# Patient Record
Sex: Female | Born: 1970
Health system: Midwestern US, Community
[De-identification: ages and names within clinical notes are randomized; demographics above are authoritative.]

---

## 2014-11-16 ENCOUNTER — Emergency Department: Admit: 2014-11-17 | Payer: PRIVATE HEALTH INSURANCE | Primary: Internal Medicine

## 2014-11-16 DIAGNOSIS — M5412 Radiculopathy, cervical region: Secondary | ICD-10-CM

## 2014-11-16 NOTE — ED Provider Notes (Addendum)
HPI Comments: Pt presents c/o left upper chest pain / neck pain x 4 weeks intermittent . Pain radiating to left arm aggravated with movement of her neck . Pt was seen in [facility]. She was seen by Cardiologist stress test was neg as per pt . Denies sob, palpitation, n/v/d.     Patient is a 44 y.o. female presenting with chest pain. The history is provided by the patient.   Chest Pain (Angina)   Pertinent negatives include no abdominal pain, no cough, no dizziness, no fever, no headaches, no nausea, no palpitations and no vomiting.        Past Medical History:   Diagnosis Date   ??? Acid reflux        History reviewed. No pertinent past surgical history.      History reviewed. No pertinent family history.    History     Social History   ??? Marital Status: UNKNOWN     Spouse Name: N/A   ??? Number of Children: N/A   ??? Years of Education: N/A     Occupational History   ??? Not on file.     Social History Main Topics   ??? Smoking status: Never Smoker    ??? Smokeless tobacco: Not on file   ??? Alcohol Use: No   ??? Drug Use: No   ??? Sexual Activity: Not on file     Other Topics Concern   ??? Not on file     Social History Narrative   ??? No narrative on file         ALLERGIES: Azithromycin and Fish containing products    Review of Systems   Constitutional: Negative for fever and chills.   Respiratory: Negative for cough and wheezing.    Cardiovascular: Positive for chest pain. Negative for palpitations and leg swelling.   Gastrointestinal: Negative for nausea, vomiting and abdominal pain.   Musculoskeletal: Positive for neck pain.   Skin: Negative for color change and wound.   Neurological: Negative for dizziness and headaches.       Filed Vitals:    11/16/14 2227   BP: 122/71   Pulse: 81   Temp: 98 ??F (36.7 ??C)   Resp: 18   Height: 5' (1.524 m)   Weight: 63.504 kg (140 lb)   SpO2: 99%            Physical Exam   Constitutional: She is oriented to person, place, and time. She appears well-developed and well-nourished.   HENT:    Head: Normocephalic and atraumatic.   Eyes: Conjunctivae and EOM are normal. Pupils are equal, round, and reactive to light.   Neck: Normal range of motion. Neck supple. No JVD present. Muscular tenderness present. No spinous process tenderness present. Carotid bruit is not present. No tracheal deviation present. No thyromegaly present.       Cardiovascular: Normal rate, regular rhythm, normal heart sounds and intact distal pulses.    No murmur heard.  Pulmonary/Chest: Effort normal and breath sounds normal. No stridor. No respiratory distress. She has no wheezes. She has no rales. She exhibits no tenderness.   Abdominal: Soft. There is no tenderness. There is no rebound and no guarding.   Musculoskeletal: Normal range of motion. She exhibits no edema or tenderness.   Lymphadenopathy:     She has no cervical adenopathy.   Neurological: She is alert and oriented to person, place, and time. She has normal strength. No cranial nerve deficit. She displays a negative  Romberg sign. Coordination normal. GCS eye subscore is 4. GCS verbal subscore is 5. GCS motor subscore is 6.   Skin: Skin is warm. No rash noted. No erythema. No pallor.   Nursing note and vitals reviewed.       MDM  Number of Diagnoses or Management Options  Radiculopathy, unspecified spinal region:      Amount and/or Complexity of Data Reviewed  Tests in the radiology section of CPT??: ordered and reviewed  Independent visualization of images, tracings, or specimens: yes        Procedures    LABS:    No results found for this or any previous visit (from the past 24 hour(s)).  EKG NSR 77, NO ST ELEVATION     EMERGENCY DEPARTMENT CASE SUMMARY>    Impression/Differential Diagnosis: cervical radiculopathy     Plan: meds, images     ED Course: d/c home pmd f/u     Final Impression/Diagnosis:   1. Radiculopathy, unspecified spinal region        Patient condition at time of disposition: stable       I have reviewed the following home medications:     Prior to Admission medications    Medication Sig Start Date End Date Taking? Authorizing Provider   oxyCODONE-acetaminophen (PERCOCET) 5-325 mg per tablet Take 1 Tab by mouth every four (4) hours as needed for Pain. Max Daily Amount: 6 Tabs. 11/17/14  Yes Aleen Campi, PA   metaxalone (SKELAXIN) 800 mg tablet Take 1 Tab by mouth three (3) times daily. 11/17/14  Yes Aleen Campi, PA   aspirin 81 mg chewable tablet Take 81 mg by mouth daily.   Yes Phys Other, MD         Aleen Campi, PA    I was personally available for consultation in the emergency department.  I have reviewed the chart and agree with the documentation recorded by the [facility], including the assessment, treatment plan, and disposition.  Agustin Cree, MD

## 2014-11-16 NOTE — ED Notes (Signed)
Rec'd pt with c/o chest pain on & off x4 weeks

## 2014-11-17 ENCOUNTER — Inpatient Hospital Stay
Admit: 2014-11-17 | Discharge: 2014-11-17 | Disposition: A | Discharge status: Home / Self Care | Payer: PRIVATE HEALTH INSURANCE | Attending: Emergency Medicine

## 2014-11-17 MED ORDER — OXYCODONE-ACETAMINOPHEN 5 MG-325 MG TAB
5-325 mg | ORAL_TABLET | ORAL | Status: AC | PRN
Start: 2014-11-17 — End: ?

## 2014-11-17 MED ORDER — KETOROLAC TROMETHAMINE 30 MG/ML INJECTION
30 mg/mL (1 mL) | INTRAMUSCULAR | Status: AC
Start: 2014-11-17 — End: 2014-11-16
  Administered 2014-11-17: 04:00:00 via INTRAMUSCULAR

## 2014-11-17 MED ORDER — METAXALONE 800 MG TAB
800 mg | ORAL_TABLET | Freq: Three times a day (TID) | ORAL | Status: AC
Start: 2014-11-17 — End: ?

## 2014-11-17 MED ORDER — CYCLOBENZAPRINE 10 MG TAB
10 mg | ORAL | Status: AC
Start: 2014-11-17 — End: 2014-11-16
  Administered 2014-11-17: 04:00:00 via ORAL

## 2014-11-17 MED FILL — KETOROLAC TROMETHAMINE 30 MG/ML INJECTION: 30 mg/mL (1 mL) | INTRAMUSCULAR | Qty: 1

## 2014-11-17 MED FILL — CYCLOBENZAPRINE 10 MG TAB: 10 mg | ORAL | Qty: 1

## 2014-11-17 NOTE — ED Notes (Signed)
Pt is discharged to home with instructions and verbalized understanding, Safety maintained.

## 2014-11-20 LAB — EKG, 12 LEAD, INITIAL
Atrial Rate: 77 {beats}/min
Calculated P Axis: 56 degrees
Calculated R Axis: 70 degrees
Calculated T Axis: 43 degrees
Diagnosis: NORMAL
P-R Interval: 134 ms
Q-T Interval: 388 ms
QRS Duration: 76 ms
QTC Calculation (Bezet): 439 ms
Ventricular Rate: 77 {beats}/min

## 2015-02-28 IMAGING — MG MAMMO DIAG LT
2 series · 2 of 2 positions shown · non-contrast
Comparison: none

Images Obtained from [facility]
Comparison is made to exam dated:  02/12/2015.
The tissue of the left breast is heterogeneously dense. This may lower the sensitivity of mammography.
Patient was referred today for stereotactic biopsy of left breast calcifications seen on outside films. Due to the suboptimal resolution of outside films, we performed pre-procedural imaging to
confirm and better define the lesion.
There is a 0.3 cm x 0.3 cm x 0.3 cm cluster of amorphous punctate calcifications in the left breast at 1 o'clock middle depth.
No other significant masses or calcifications are seen in the breast.
Your patient's mammogram demonstrates that she has dense breast tissue, which could hide small abnormalities.  In compliance with [place] Act [place] 1521 the patient has been sent a letter which informs
her that she has dense breast tissue and might benefit from supplementary screening tests depending on her individual risk factors.  The patient may contact you if she has any questions or concerns.

[L LM]
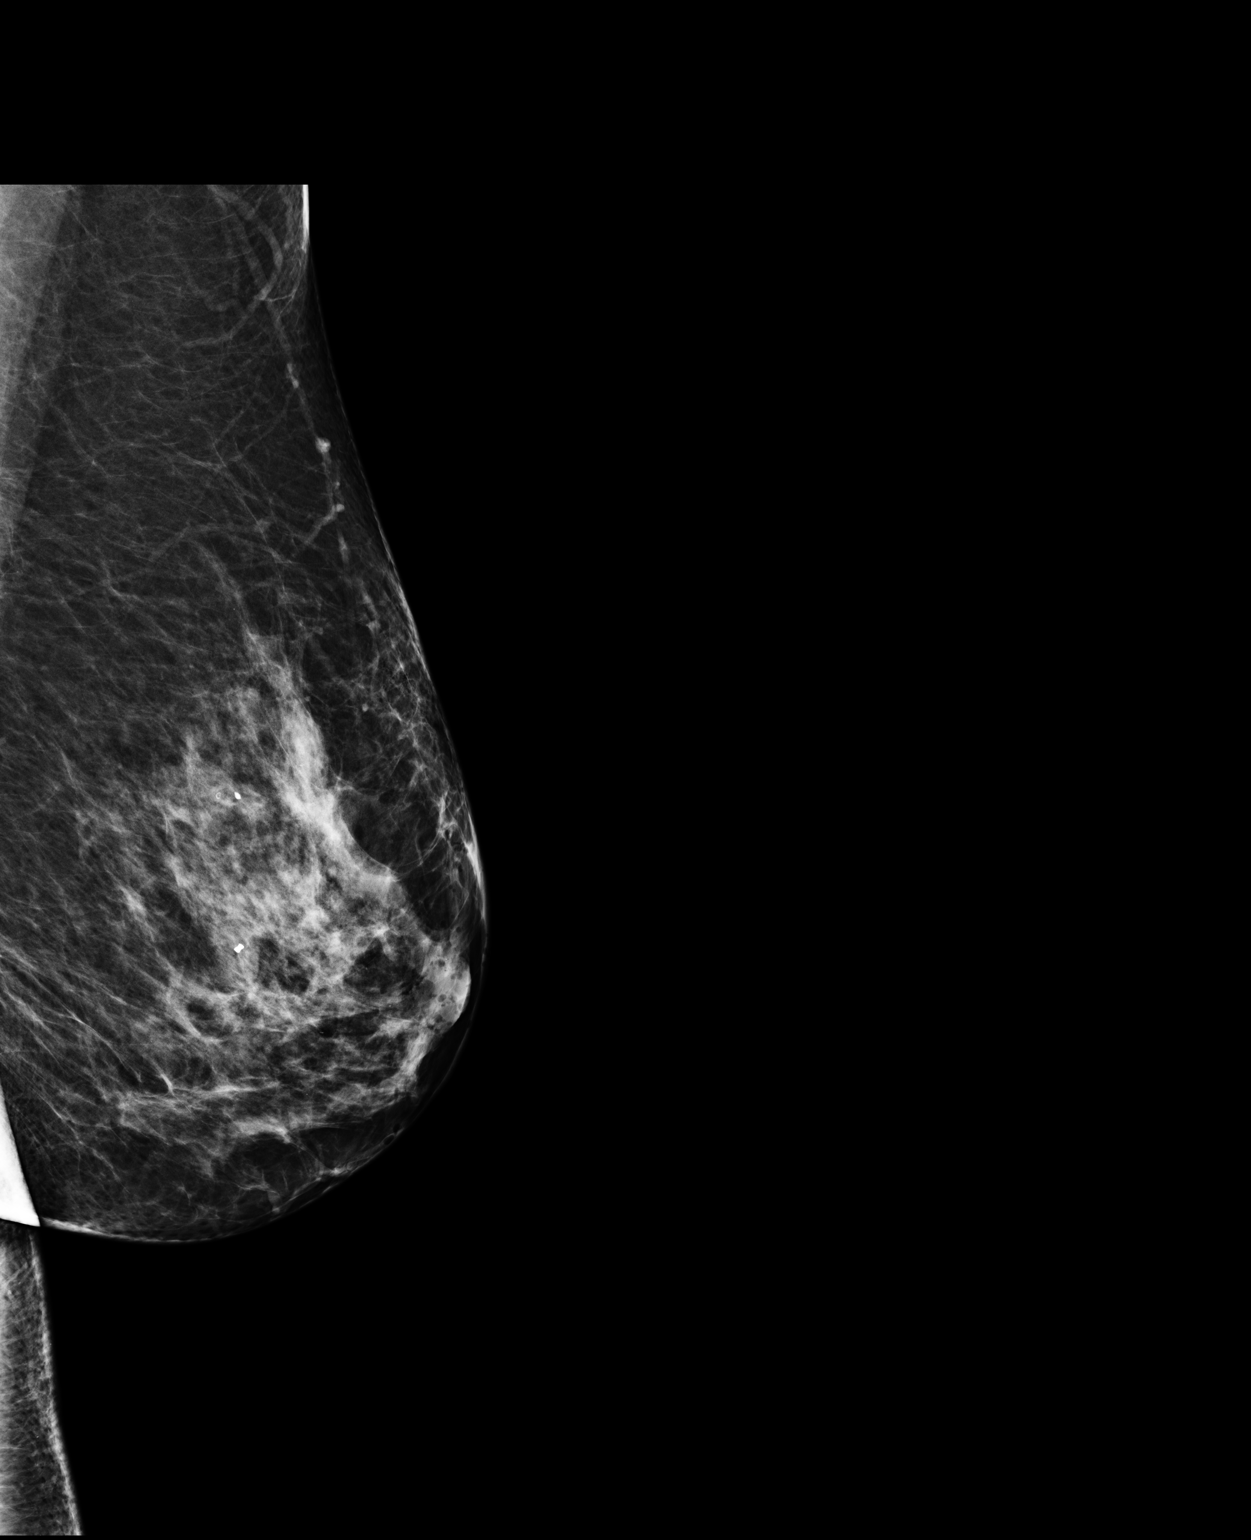

[L CC]
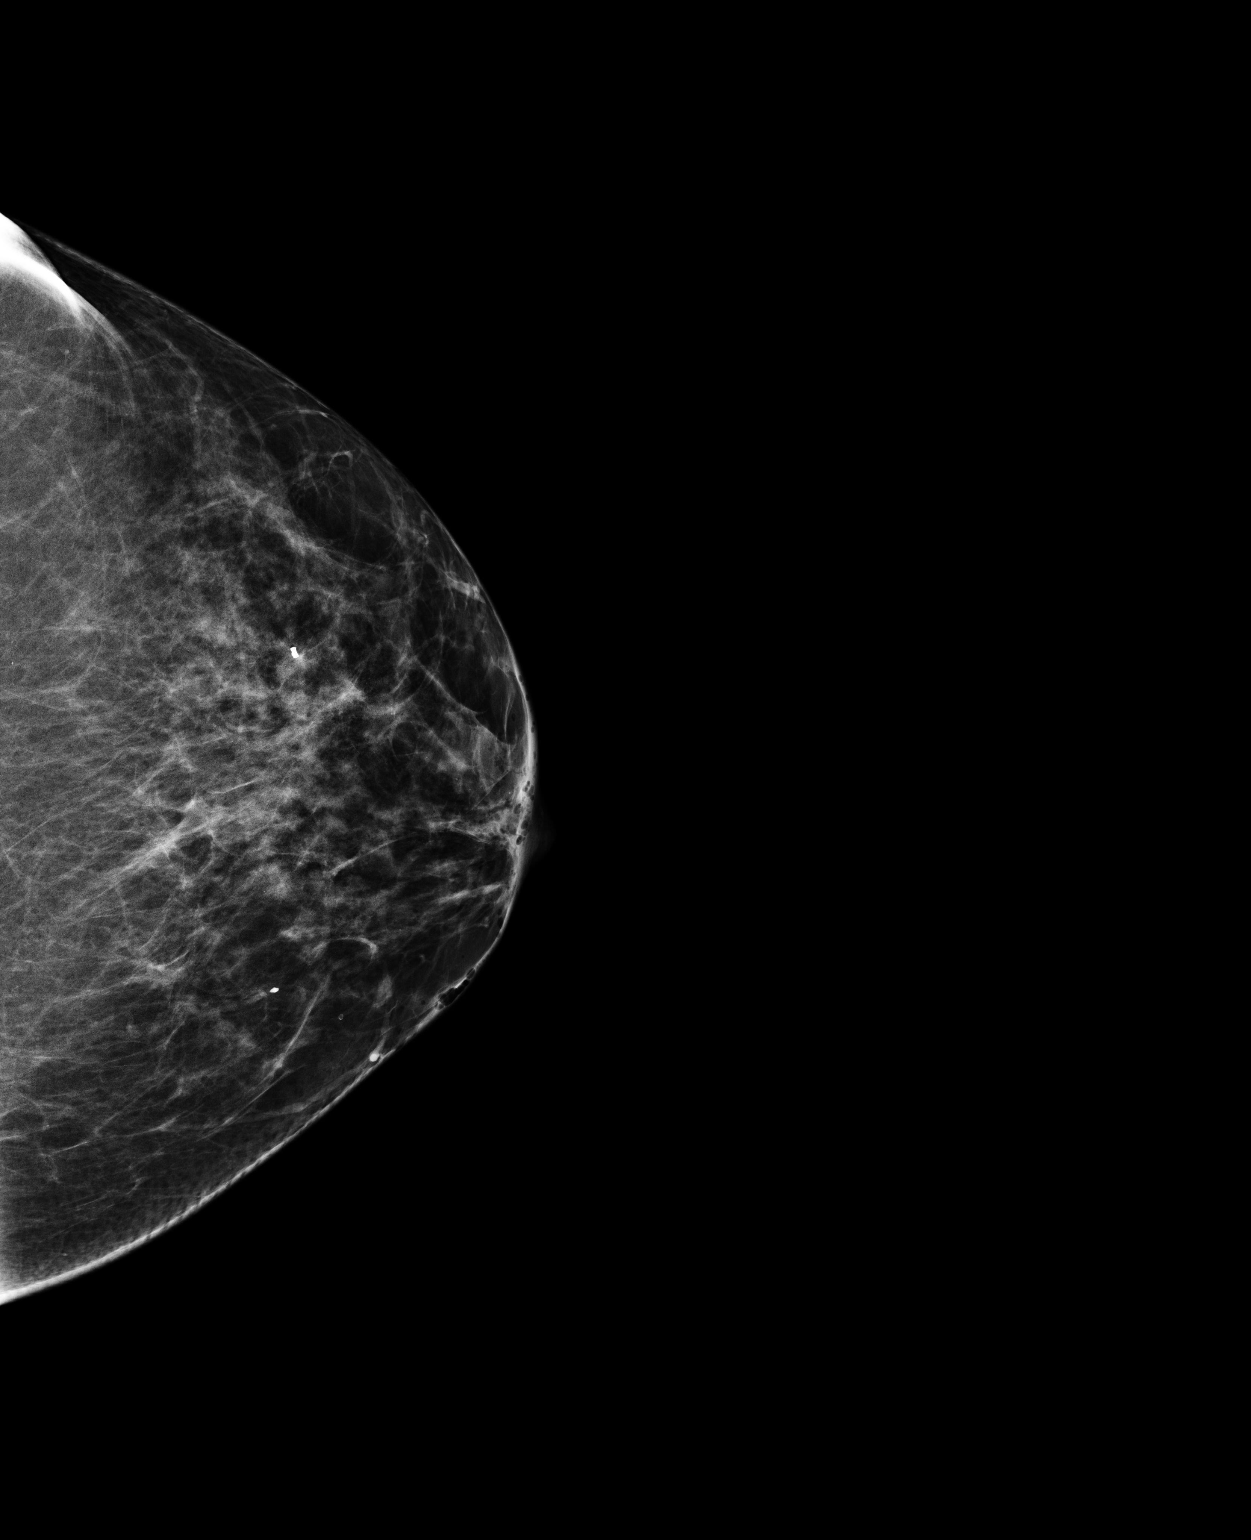

[2 of 2 positions shown; findings below may reference images not displayed]

IMPRESSION: The 0.3 cm x 0.3 cm x 0.3 cm cluster of amorphous punctate calcifications in the left breast is at a low suspicion for malignancy.  We will proceed with stereotactic biopsy per outside report
recommendations.
mc/:02/28/2015 [DATE]
Mammogram BI-RADS: 4a Suspicious abnormality - low suspicion for malignancy   [id number]

## 2015-02-28 IMAGING — MG STEREO BIOPSY LT
8 series · 8 of 8 positions shown · non-contrast
Comparison: none

Images Obtained from [facility]
CLINICAL RA REF: Biopsy of the left breast for calcifications.
Comparison is made to exam dated:  02/28/2015 [HOSPITAL] - [HOSPITAL].
The tissue of the left breast is heterogeneously dense. This may lower the sensitivity of mammography.
There is a marker clip in the appropriate position in the left breast at 1 o'clock anterior depth.  This marker clip placement is at biopsy site.
Your patient's mammogram demonstrates that she has dense breast tissue, which could hide small abnormalities.  In compliance with [place] Act [place] 3633 the patient has been sent a letter which informs
her that she has dense breast tissue and might benefit from supplementary screening tests depending on her individual risk factors.  The patient may contact you if she has any questions or concerns.

[L CC (1 of 8)]
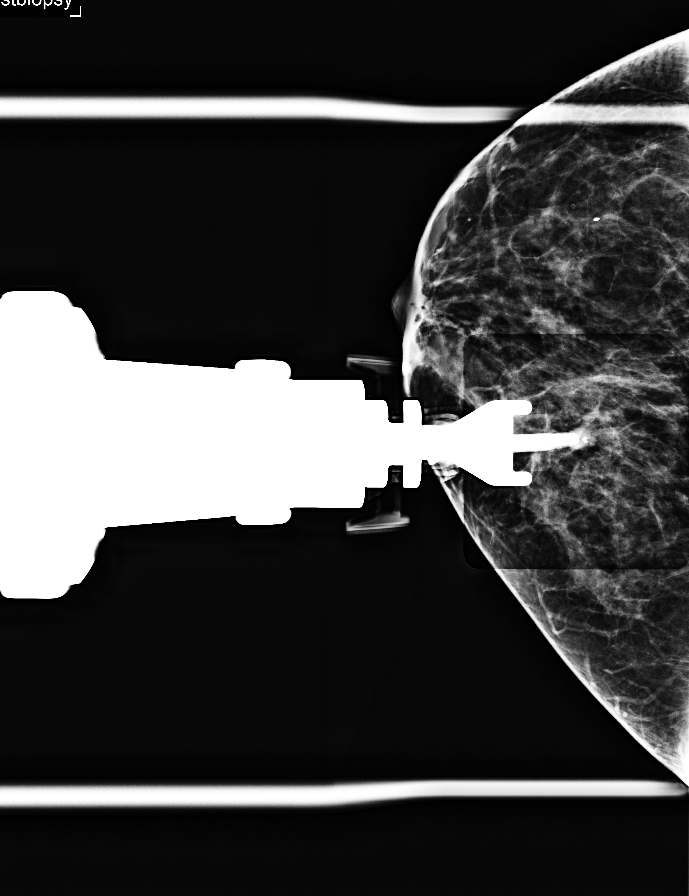

[L CC (2 of 8)]
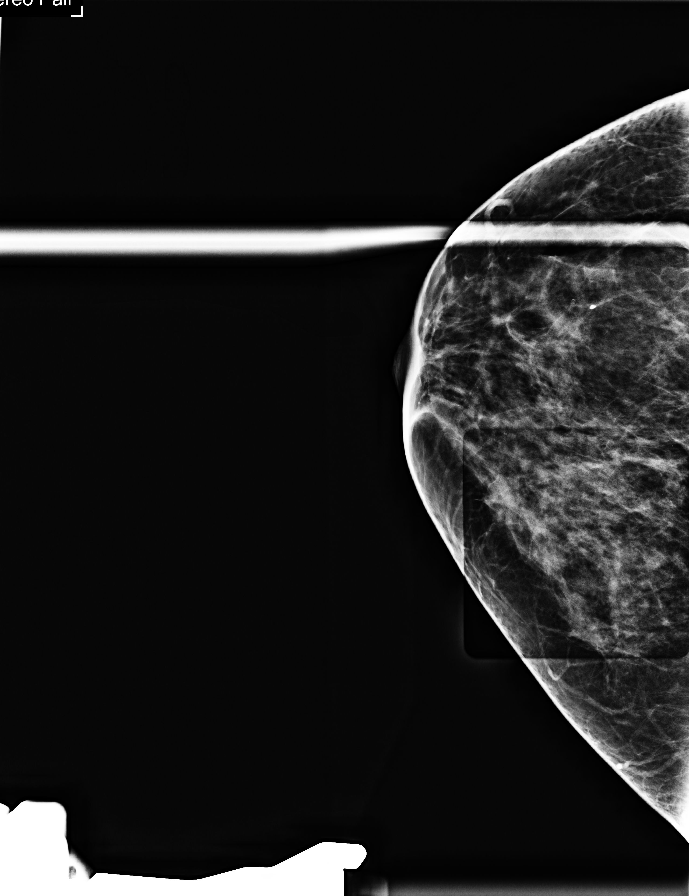

[L CC (3 of 8)]
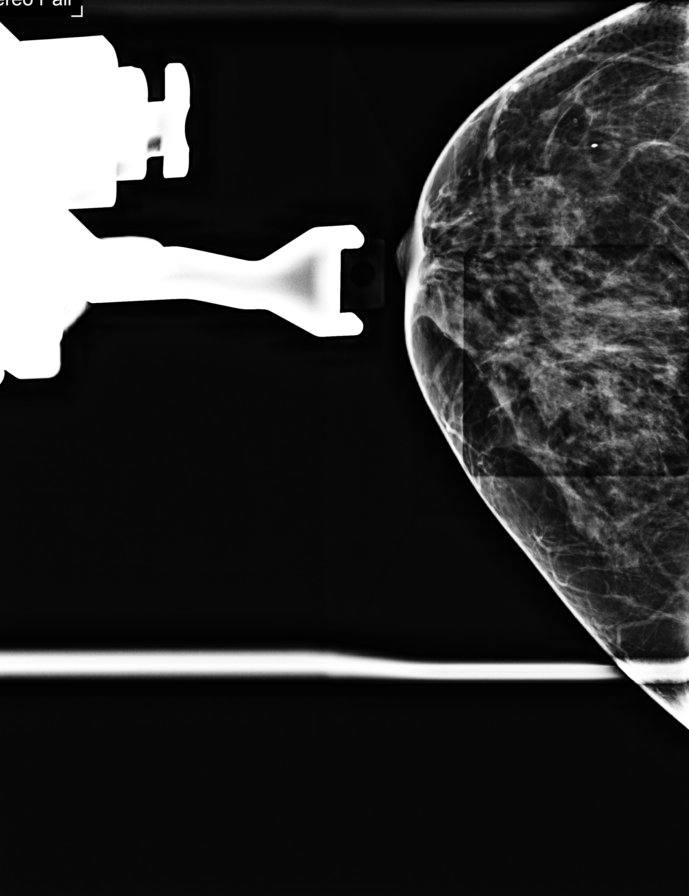

[L CC (4 of 8)]
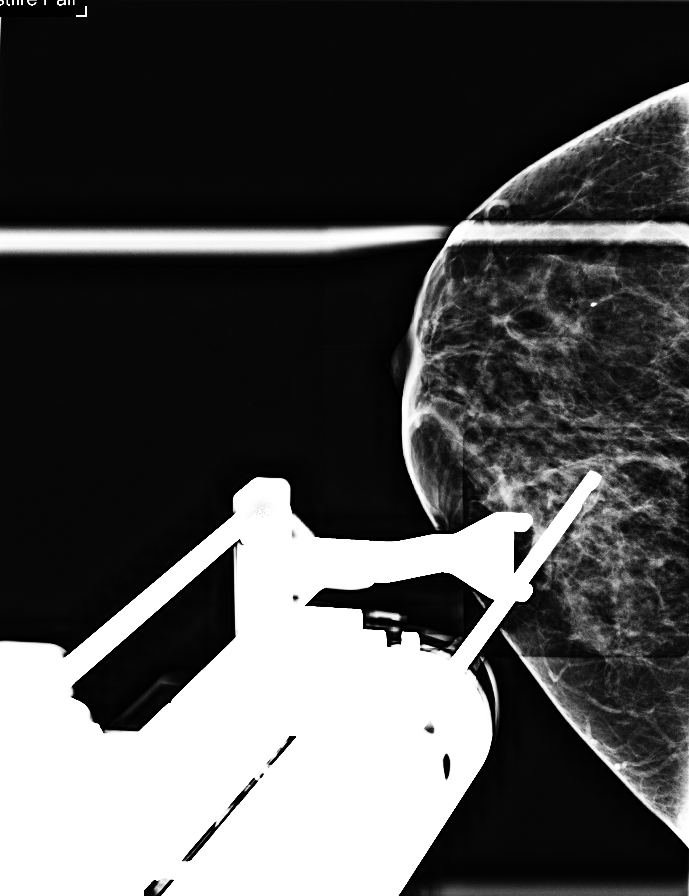

[L CC (5 of 8)]
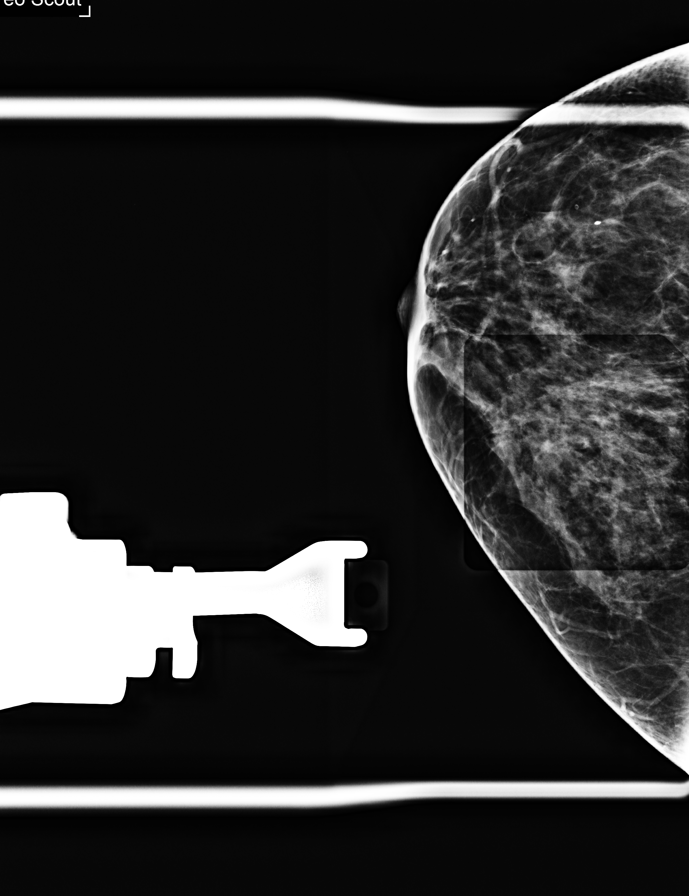

[L CC (6 of 8)]
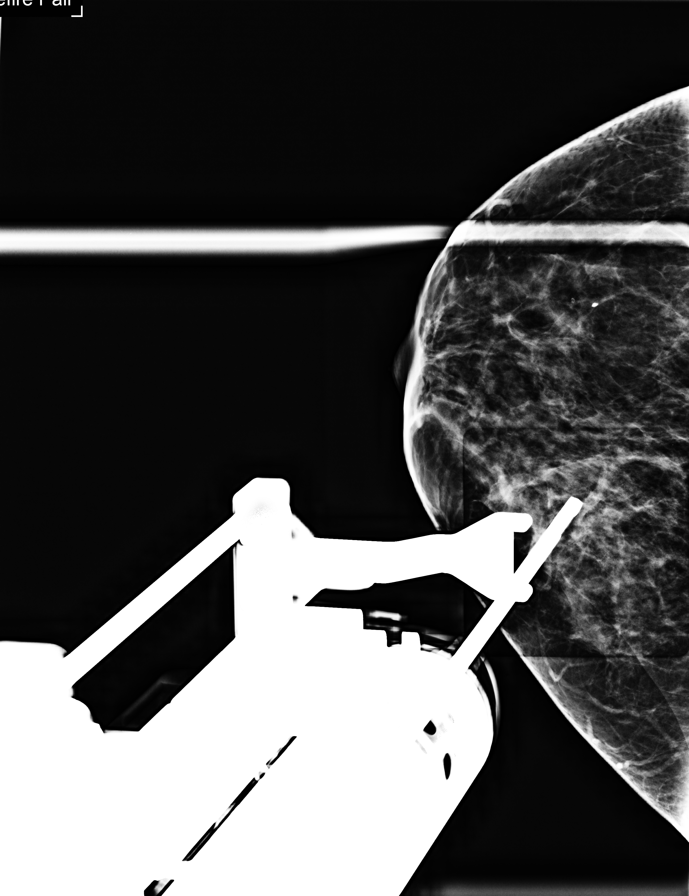

[L CC (7 of 8)]
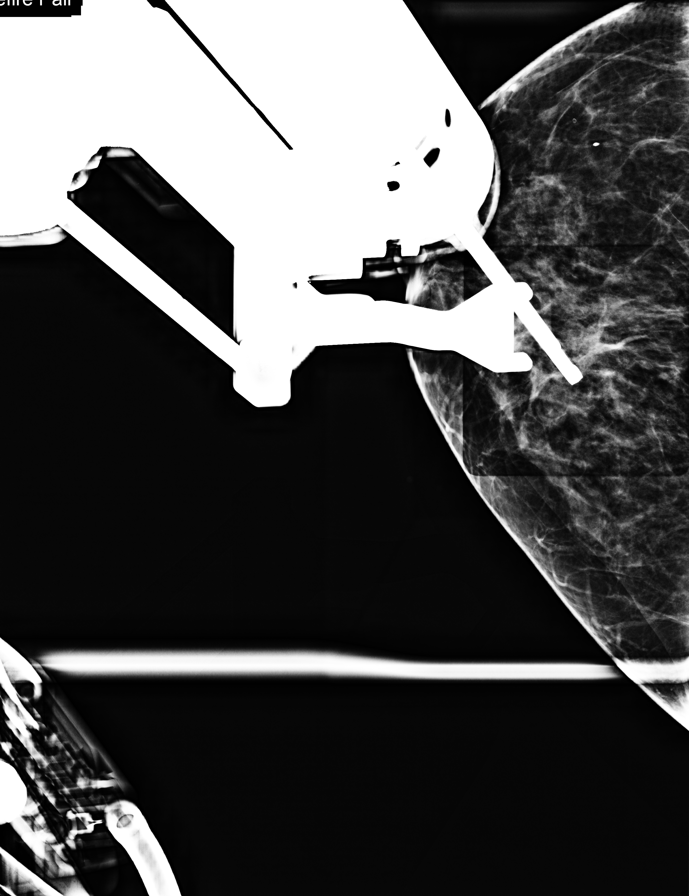

[L CC (8 of 8)]
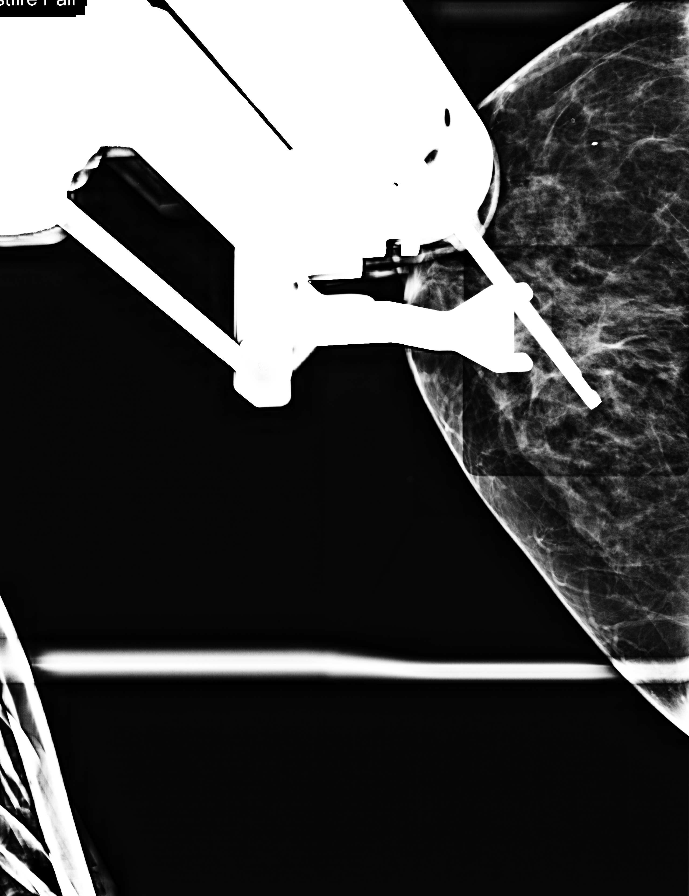

[8 of 8 positions shown; findings below may reference images not displayed]

IMPRESSION: There was a successful marker clip placement in the left breast anterior depth.
SUMMARY: Biopsy results pending. Please see biopsy report for additional findings.
mc/penrad:02/28/2015 [DATE]
Mammogram BI-RADS: Post biopsy marker   [id number]

## 2015-02-28 IMAGING — MG DIAGNOSTIC MAMMO No Charge
6 series · 6 of 14 positions shown · non-contrast
Comparison: none

Images Obtained from [facility]
CLINICAL RA REF: Biopsy of the left breast for calcifications.
Comparison is made to exam dated:  02/28/2015 [HOSPITAL] - [HOSPITAL].
The tissue of the left breast is heterogeneously dense. This may lower the sensitivity of mammography.
There is a marker clip in the appropriate position in the left breast at 1 o'clock anterior depth.  This marker clip placement is at biopsy site.
Your patient's mammogram demonstrates that she has dense breast tissue, which could hide small abnormalities.  In [place] Act [place] 3633 the patient has been sent a letter which informs
her that she has dense breast tissue and might benefit from supplementary screening tests depending on her individual risk factors.  The patient may contact you if she has any questions or concerns.

[L LM]
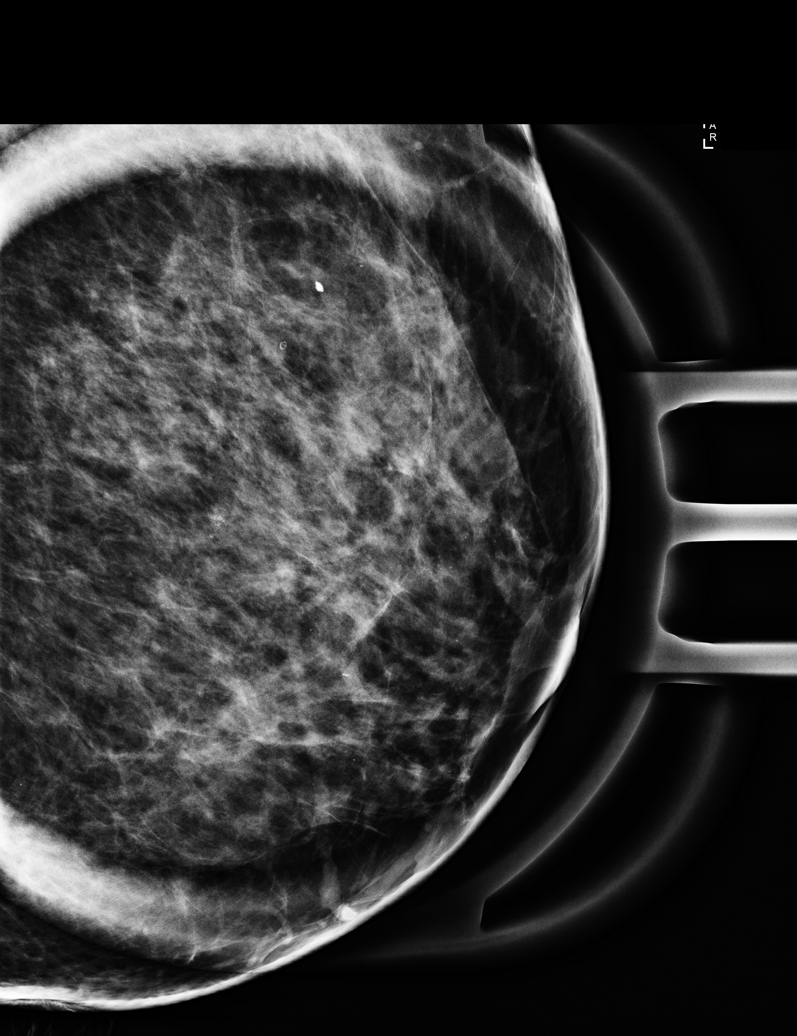

[L CC (1 of 2)]
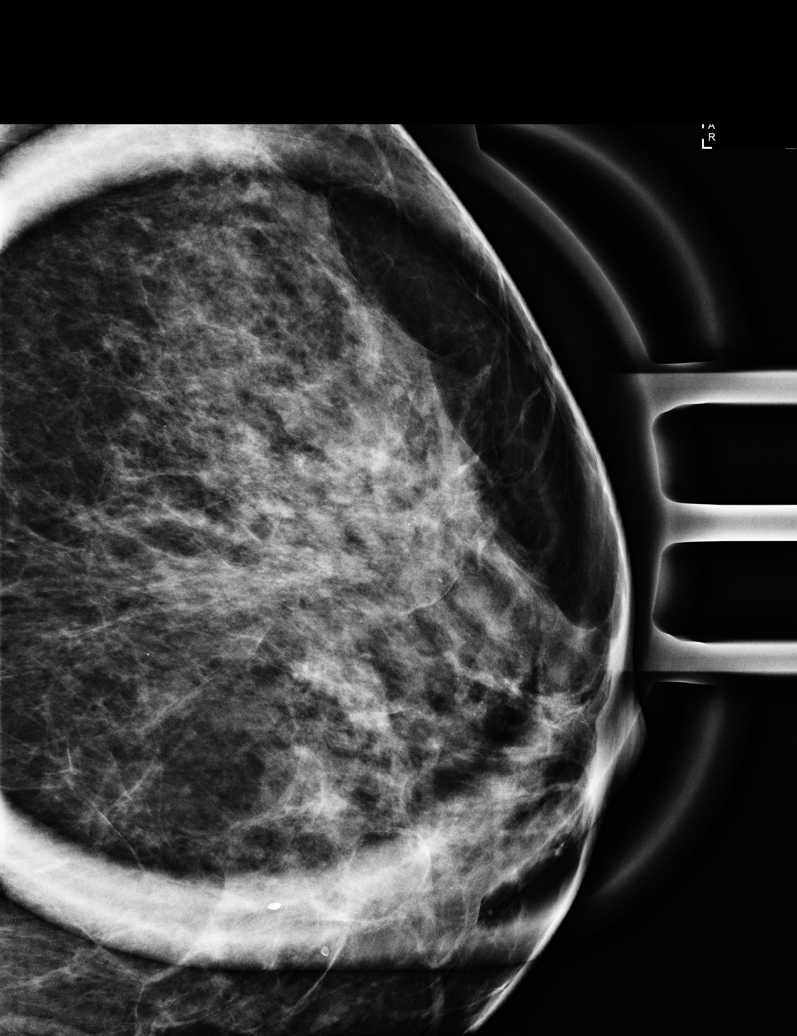

[L CC (2 of 2)]
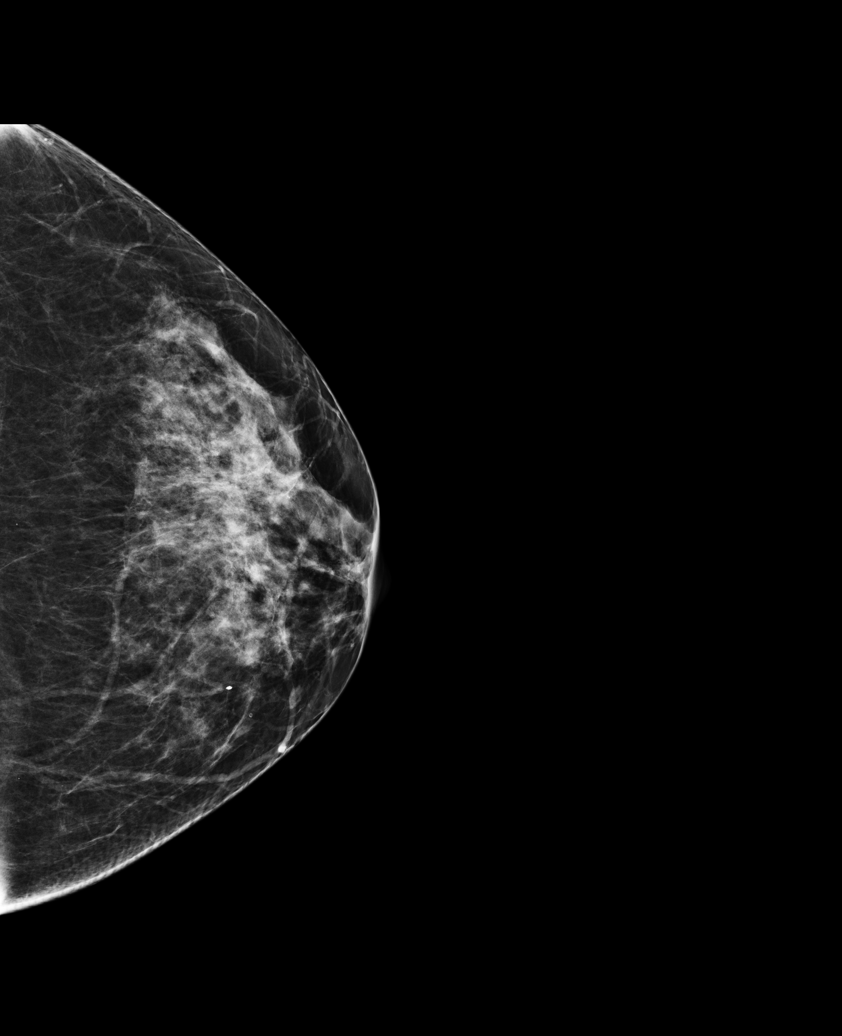

[L MLO]
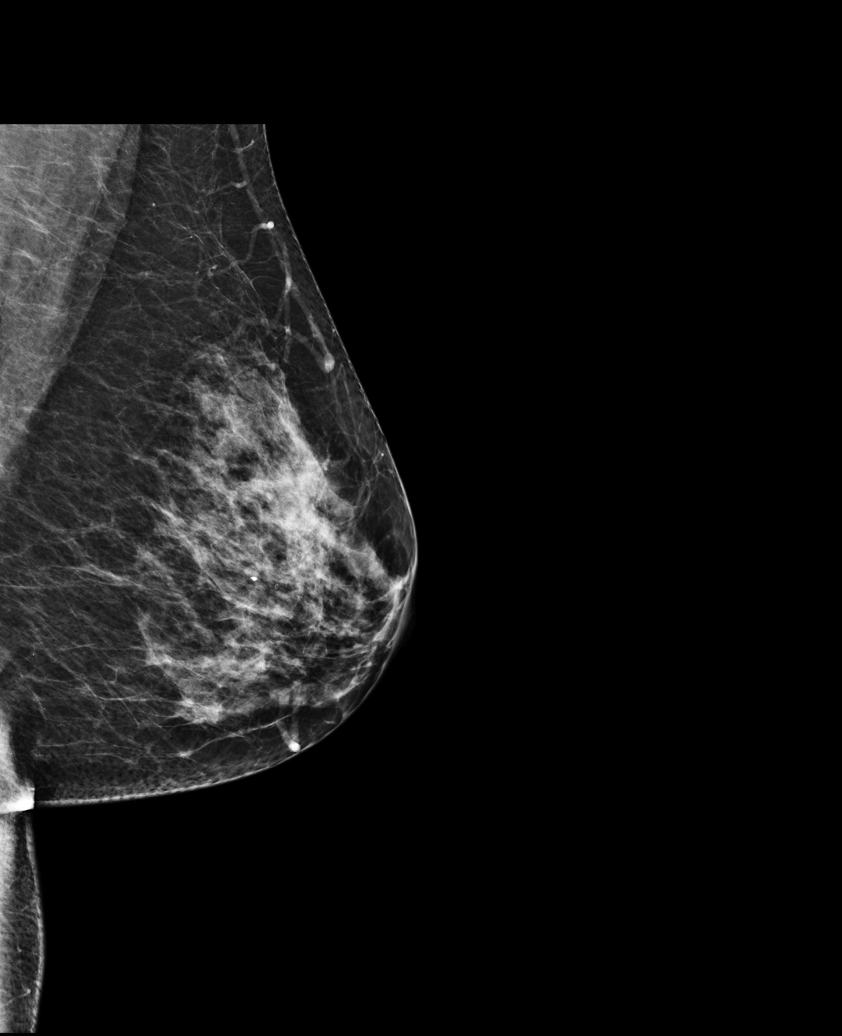

[L MLO tomo · tomo slice 38/75.0]
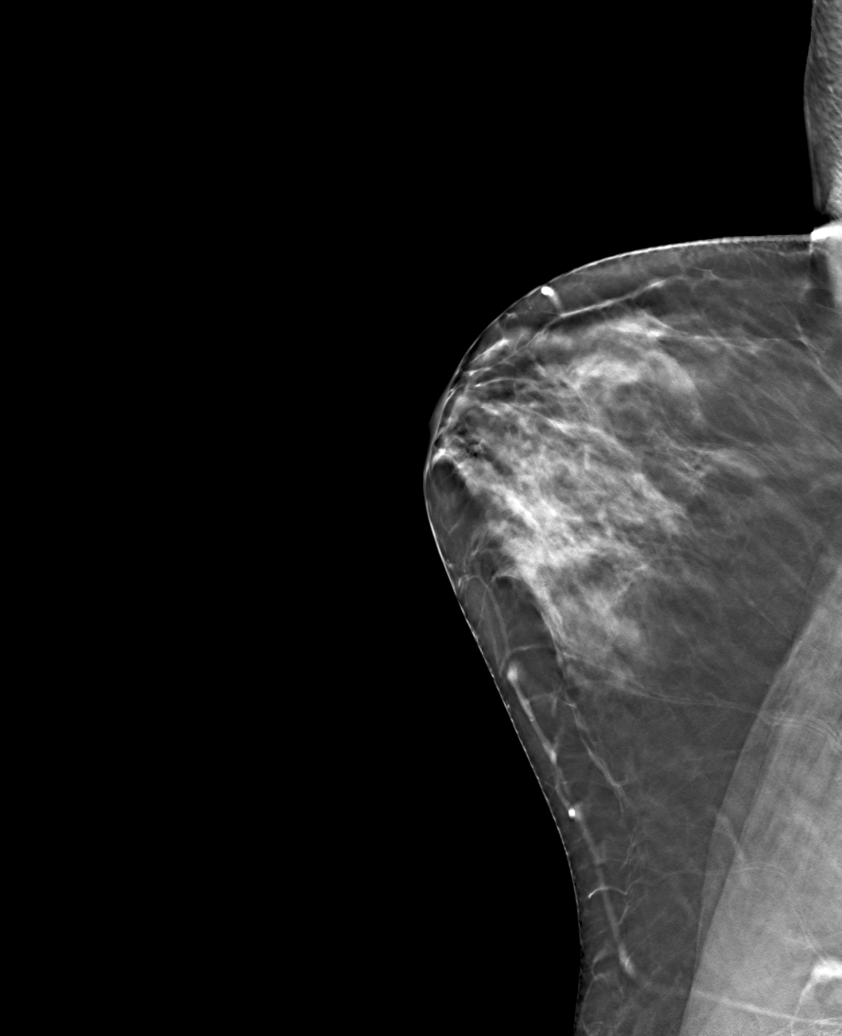

[L CC tomo · tomo slice 35/70.0]
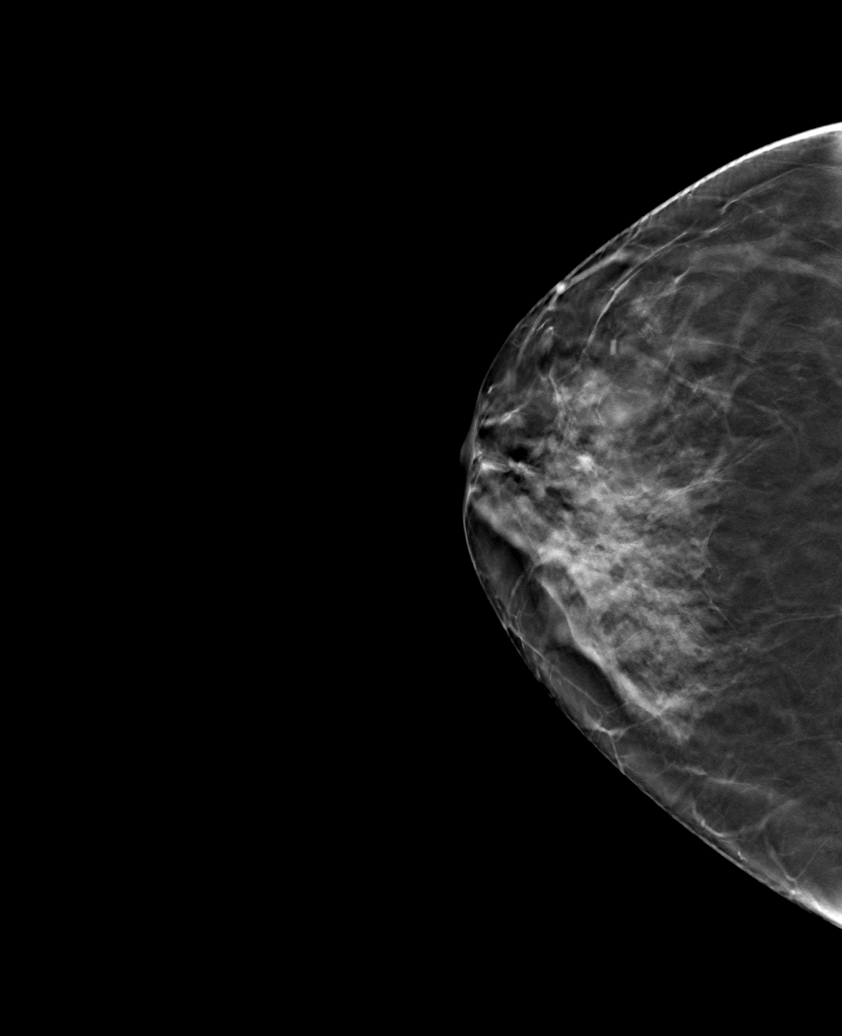

[6 of 14 positions shown; findings below may reference images not displayed]

IMPRESSION: There was a successful marker clip placement in the left breast anterior depth.
SUMMARY: Biopsy results pending. Please see biopsy report for additional findings.
mc/penrad:02/28/2015 [DATE]
Mammogram BI-RADS: Post biopsy marker   [id number]

## 2015-08-29 IMAGING — US US BREAST LT
1 series · 7 of 7 positions shown · non-contrast
Comparison: none

Images Obtained from [facility]
CLINICAL RA REF: Bilateral digital mammogram with cad and tomosynthesis. Hxo of left breast stereo benign biopsy. Left breast tenderness.
Digital images were generated from the 3D Tomosynthesis data acquired during the exam.
Comparison is made to exams dated:  02/28/2015 [HOSPITAL] - [HOSPITAL] and 02/12/2015.
The tissue of both breasts is heterogeneously dense. This may lower the sensitivity of mammography.
Current study was also evaluated with a Computer Aided Detection (CAD) system.
There is a biopsy clip in the left breast.
No significant abnormalities are seen in either breast on the mammogram or left targeted ultrasound.
Your patient's mammogram demonstrates that she has dense breast tissue, which could hide small abnormalities.  In [place] Act [place]. 2522 the patient has been sent a letter which informs
her that she has dense breast tissue and might benefit from supplementary screening tests depending on her individual risk factors.  The patient may contact you if she has any questions or concerns.

[Series 1: us breast left · 7 of 7 slices shown]
[im 1/7]
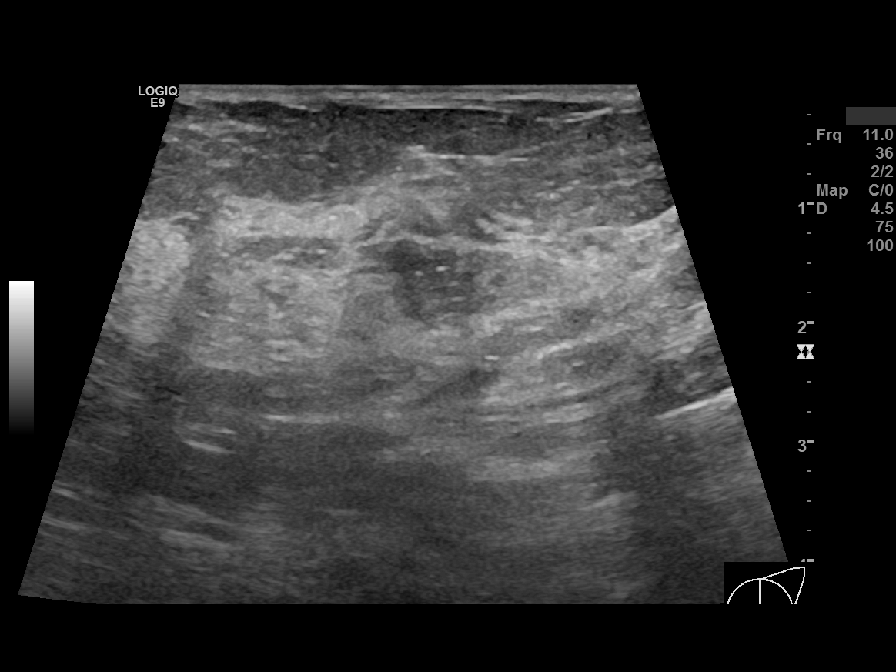
[im 2/7]
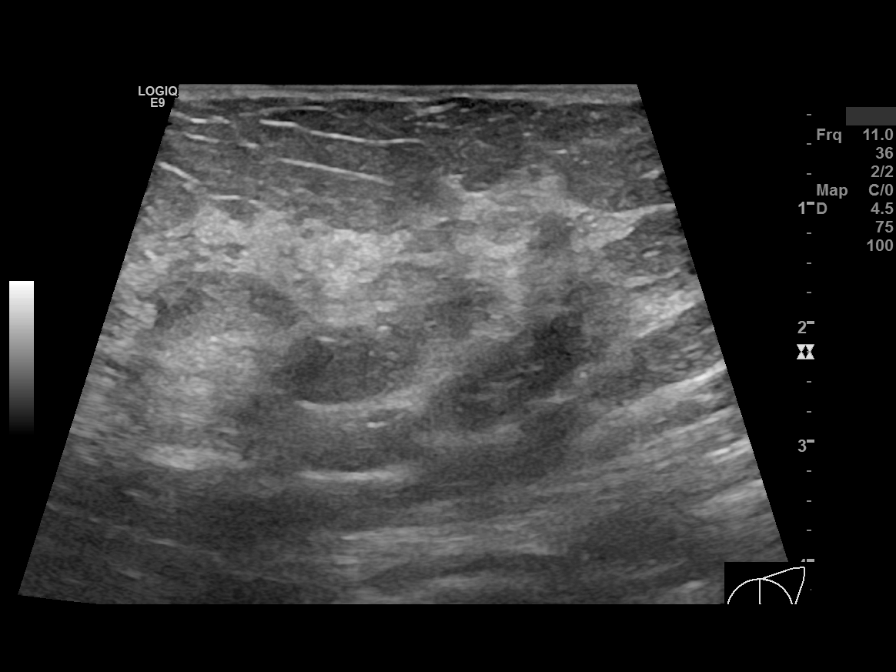
[im 3/7]
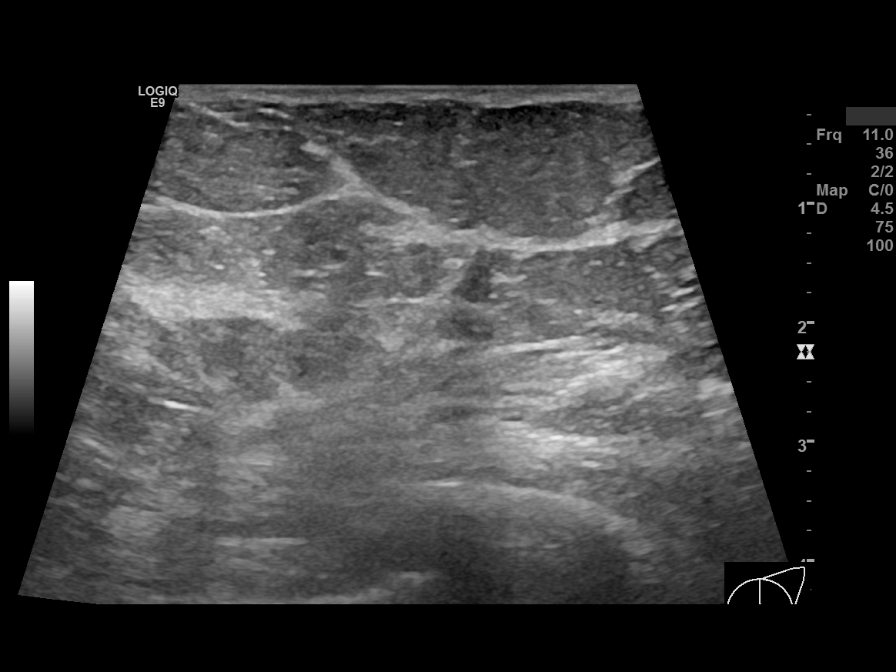
[im 4/7]
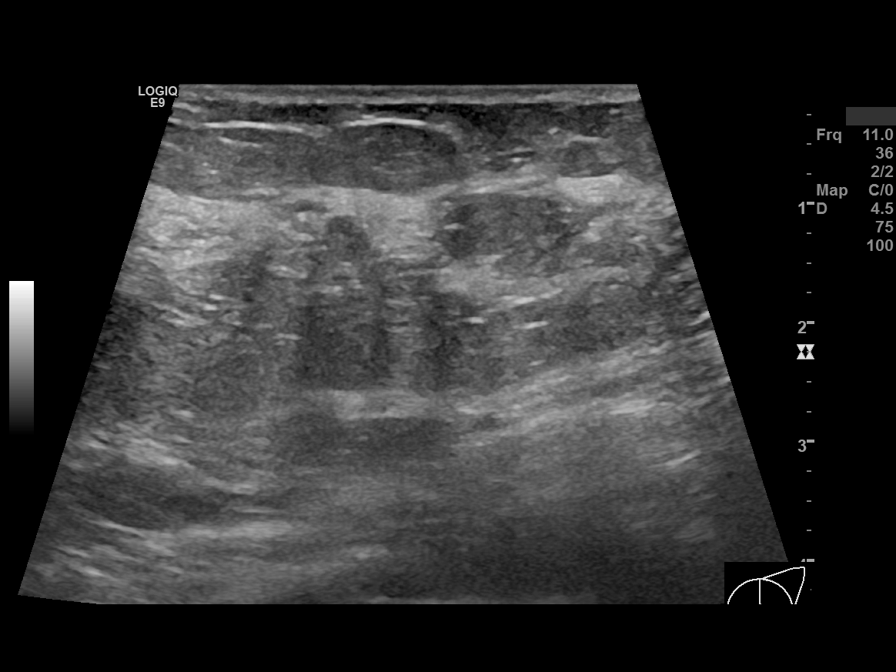
[im 5/7]
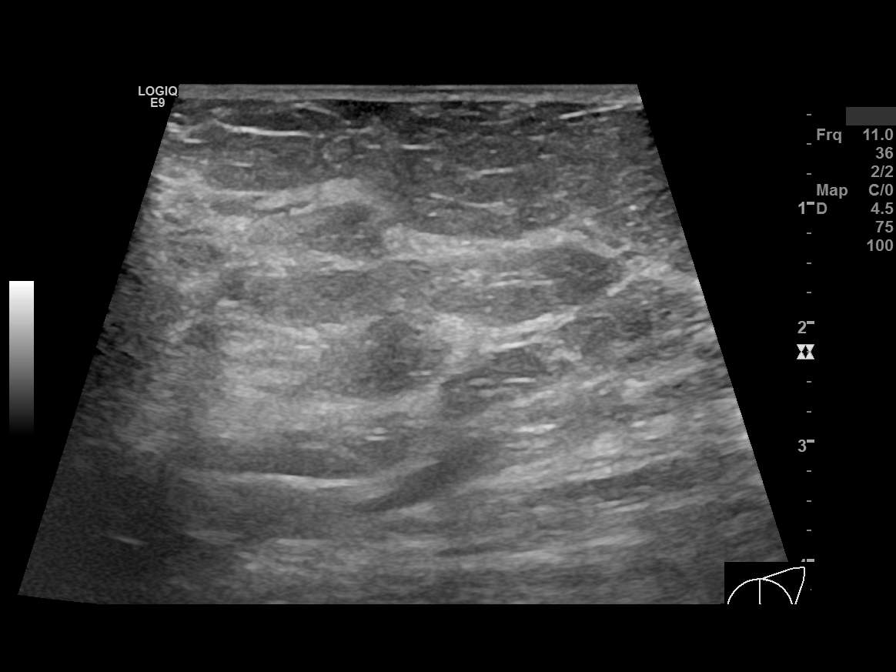
[im 6/7]
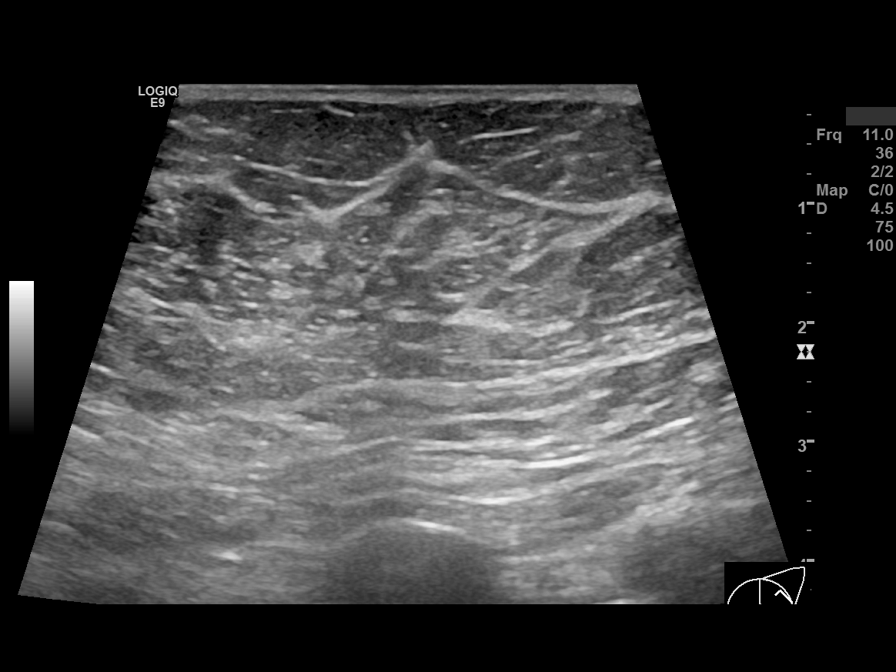
[im 7/7]
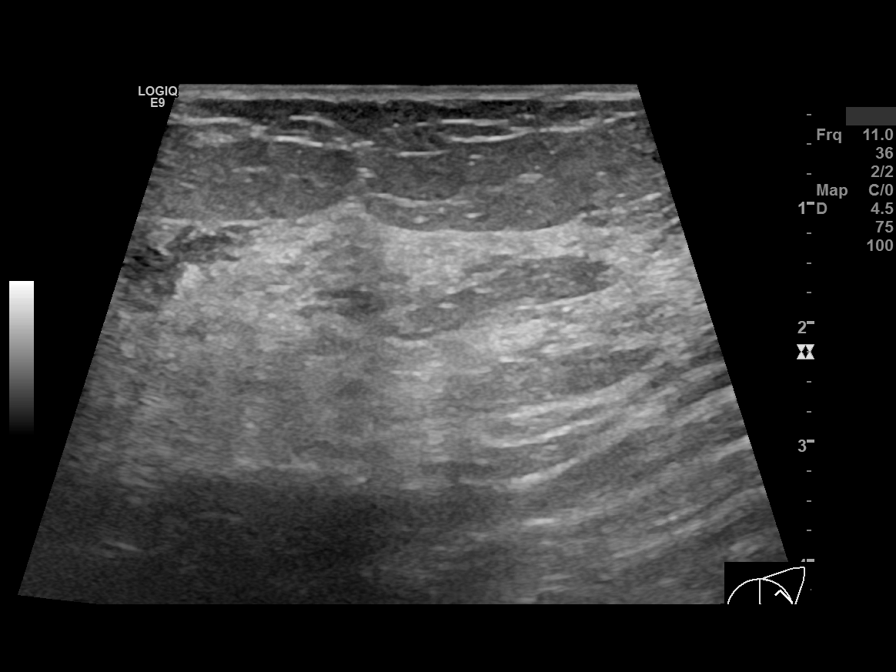

[7 of 7 positions shown; findings below may reference images not displayed]

IMPRESSION: , TARGETED ULTRASOUND IMPRESSION:
There is no mammographic or sonographic abnormality seen in the left breast to correspond with the pain.
There is no mammographic or targeted sonographic evidence of malignancy. A 1 year screening mammogram is recommended.
SUMMARY: The patient received a copy of the results at the end of the examination.
mwm/penrad:08/29/2015 [DATE]
Mammogram BI-RADS: 2 Benign  Ultrasound BI-RADS: 2 Benign   [id number]

## 2015-08-29 IMAGING — MG MAMMO DIAG BIL W/CAD TOMO
8 series · 8 of 24 positions shown · non-contrast
Comparison: none

Images Obtained from [facility]
CLINICAL RA REF: Bilateral digital mammogram with cad and tomosynthesis. Hxo of left breast stereo benign biopsy. Left breast tenderness.
Digital images were generated from the 3D Tomosynthesis data acquired during the exam.
Comparison is made to exams dated:  02/28/2015 [HOSPITAL] - [HOSPITAL] and 02/12/2015.
The tissue of both breasts is heterogeneously dense. This may lower the sensitivity of mammography.
Current study was also evaluated with a Computer Aided Detection (CAD) system.
There is a biopsy clip in the left breast.
No significant abnormalities are seen in either breast on the mammogram or left targeted ultrasound.
Your patient's mammogram demonstrates that she has dense breast tissue, which could hide small abnormalities.  In [place]. 2522 the patient has been sent a letter which informs
her that she has dense breast tissue and might benefit from supplementary screening tests depending on her individual risk factors.  The patient may contact you if she has any questions or concerns.

[R CC]
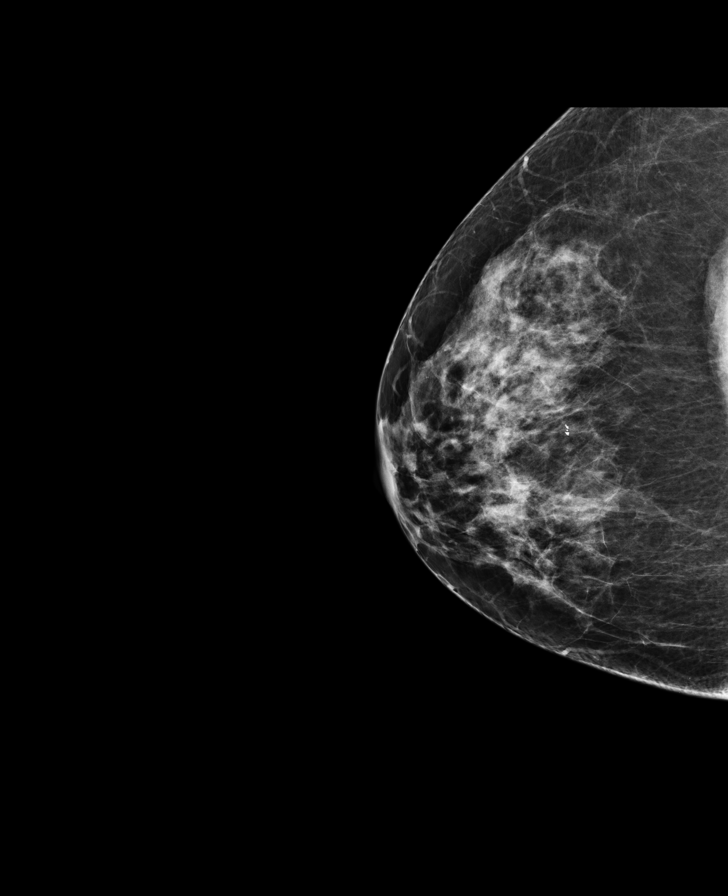

[R MLO]
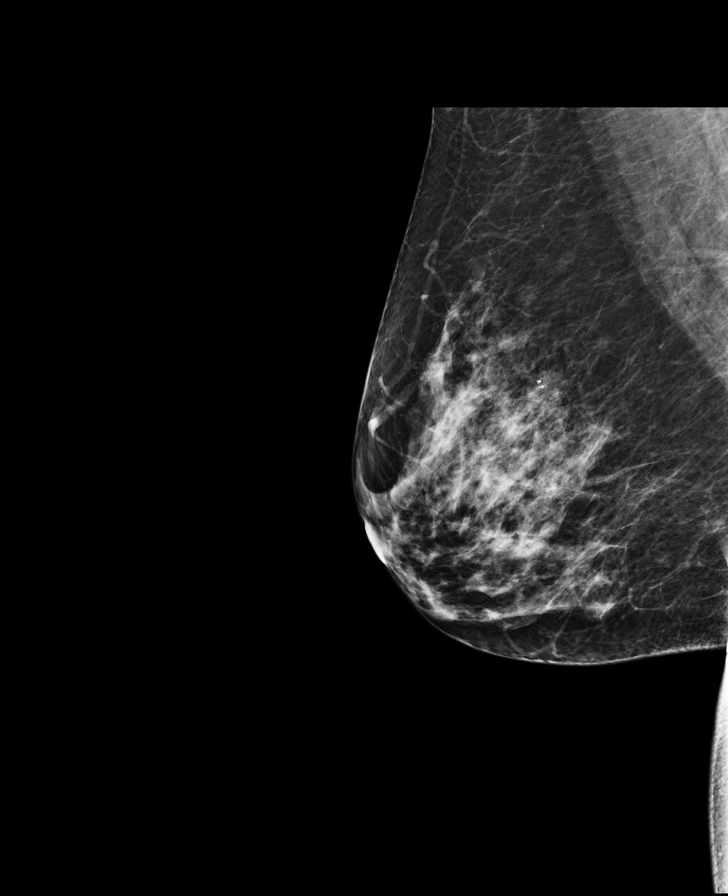

[L CC]
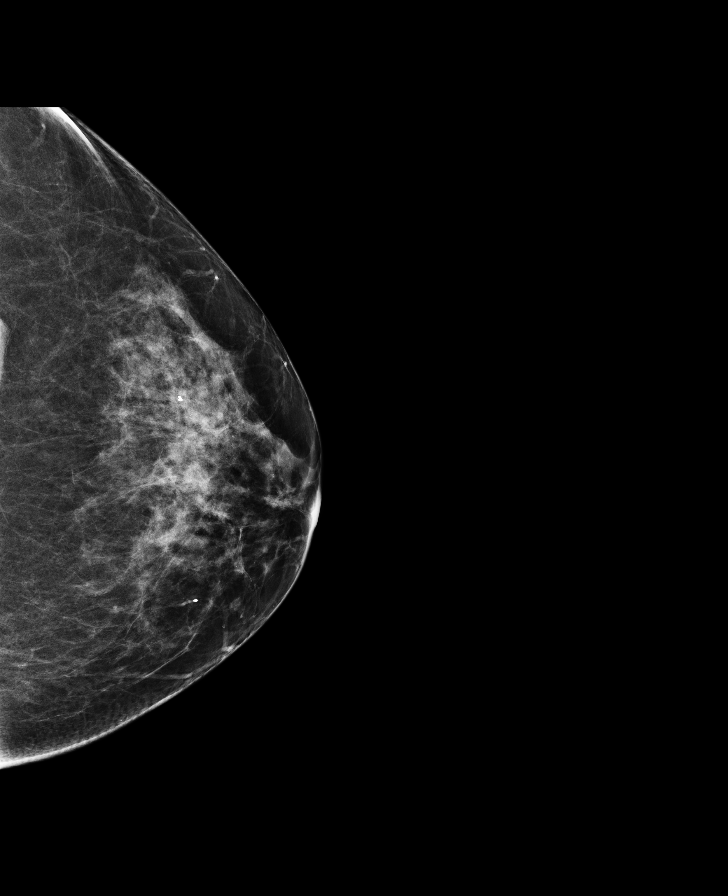

[L MLO]
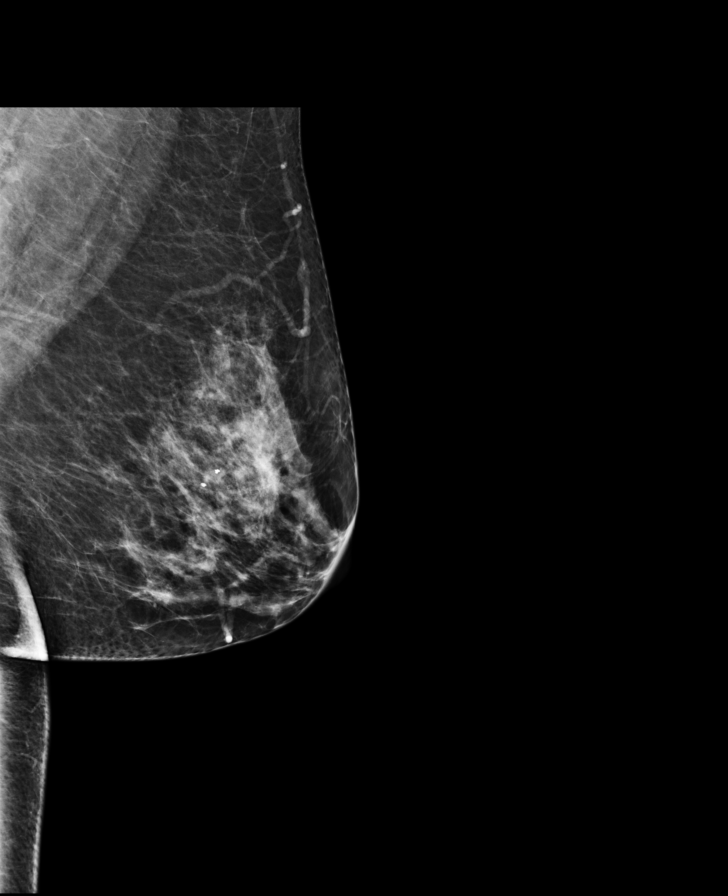

[L CC tomo · tomo slice 42/83.0]
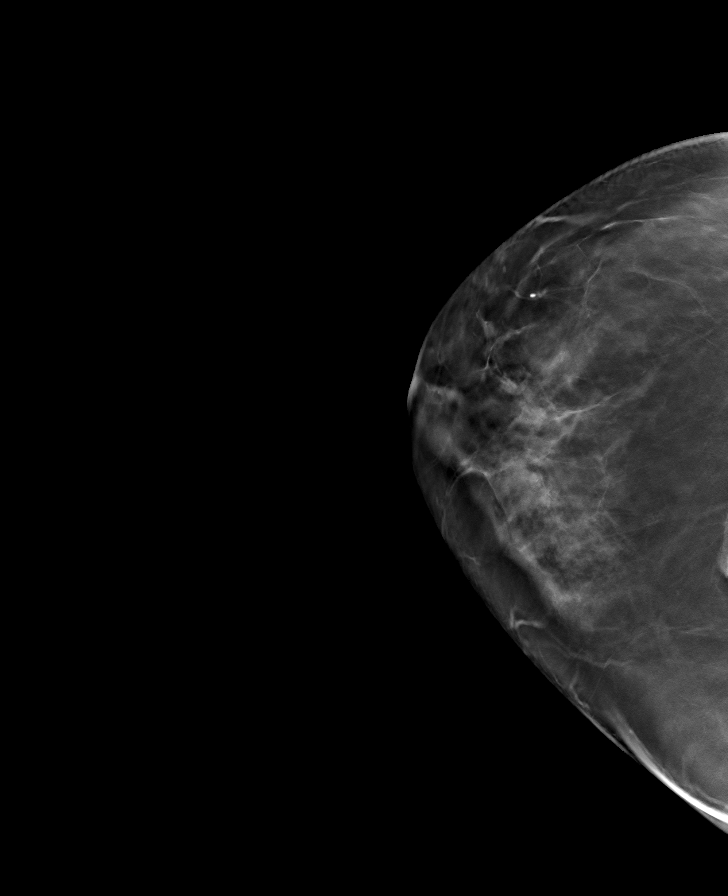

[R CC tomo · tomo slice 41/80.0]
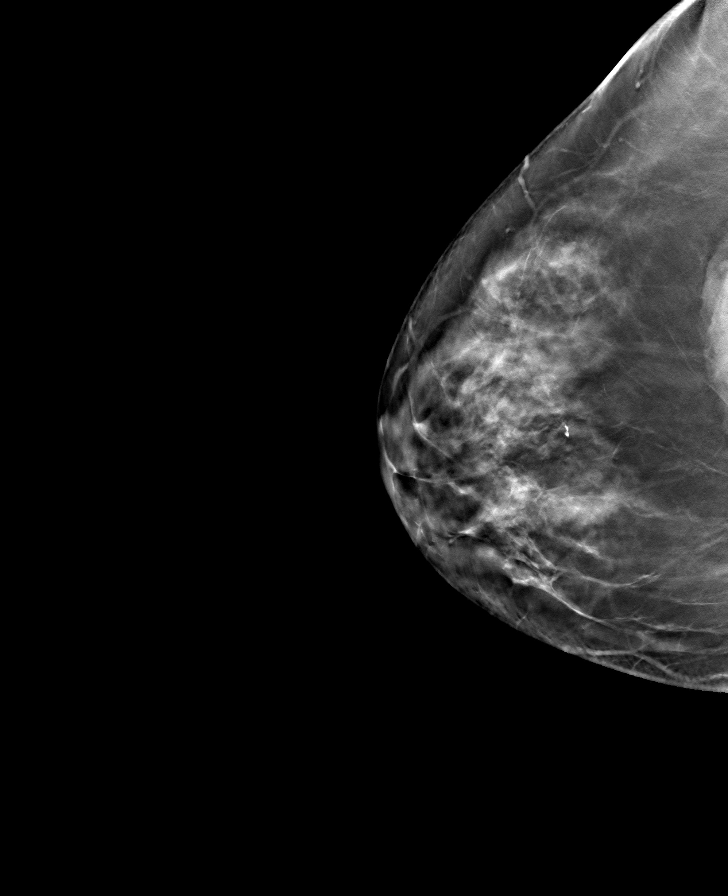

[R MLO tomo · tomo slice 40/79.0]
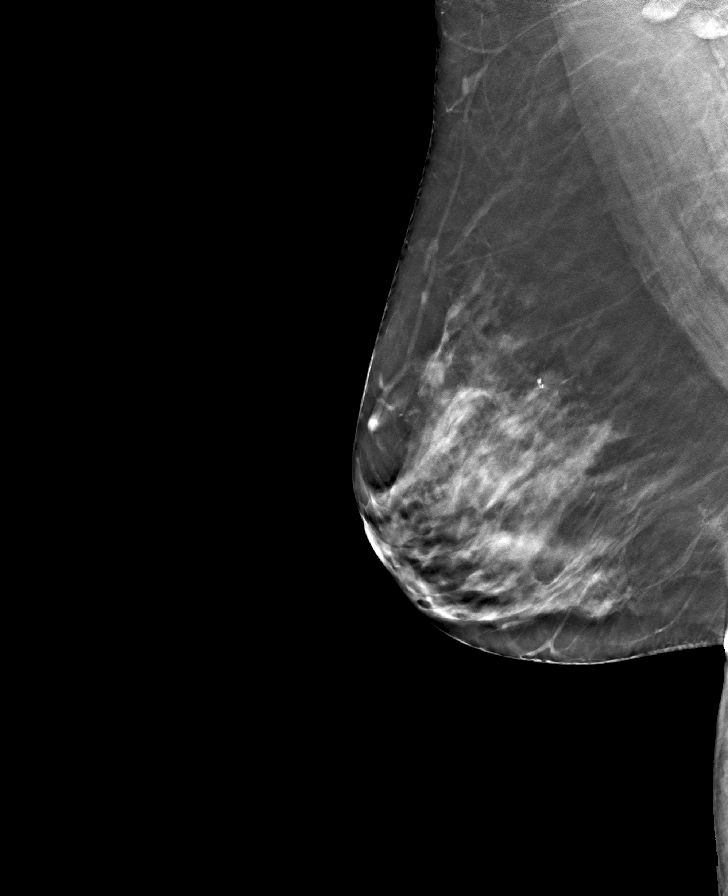

[L MLO tomo · tomo slice 43/86.0]
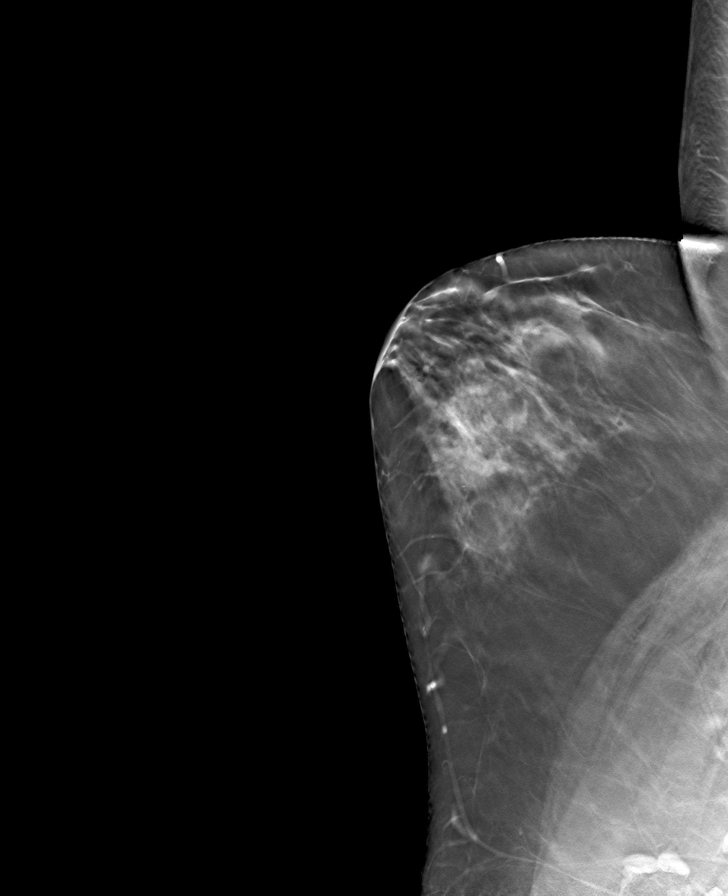

[8 of 24 positions shown; findings below may reference images not displayed]

IMPRESSION: , TARGETED ULTRASOUND IMPRESSION:
There is no mammographic or sonographic abnormality seen in the left breast to correspond with the pain.
There is no mammographic or targeted sonographic evidence of malignancy. A 1 year screening mammogram is recommended.
SUMMARY: The patient received a copy of the results at the end of the examination.
mwm/penrad:08/29/2015 [DATE]
Mammogram BI-RADS: 2 Benign  Ultrasound BI-RADS: 2 Benign   [id number]

## 2016-08-31 IMAGING — MG MAMMO SCRN BIL W/CAD TOMO
8 series · 8 of 24 positions shown · non-contrast
Comparison: none

Images Obtained from [facility]
CLINICAL RA REF: Mammo Scrn bilateral (Digital) W/Cad Routine with Tomosynthesis.
Digital images were generated from the 3D Tomosynthesis data acquired during the exam.
Comparison is made to exams dated:  08/29/2015 [HOSPITAL] - [HOSPITAL], 01/15/2015, 08/09/2014, and 07/26/2013 [facility].
The breasts are heterogeneously dense, which may obscure small masses.
Current study was also evaluated with a Computer Aided Detection (CAD) system.
There is a biopsy clip in the left breast.
No significant masses, calcifications, or other findings are seen in either breast.
There has been no significant interval change.
Your patient's mammogram demonstrates that she has dense breast tissue, which could hide small abnormalities.  In [place] with [place] Act [place]. 6316 the patient has been sent a letter which informs
her that she has dense breast tissue and might benefit from supplementary screening tests depending on her individual risk factors.  The patient may contact you if she has any questions or concerns.

[R CC]
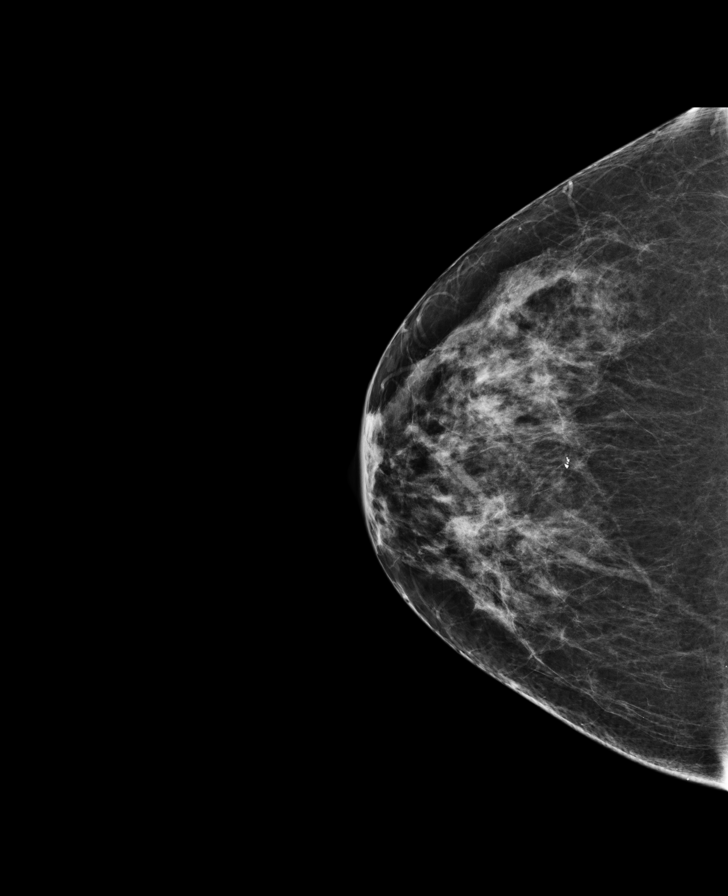

[L CC]
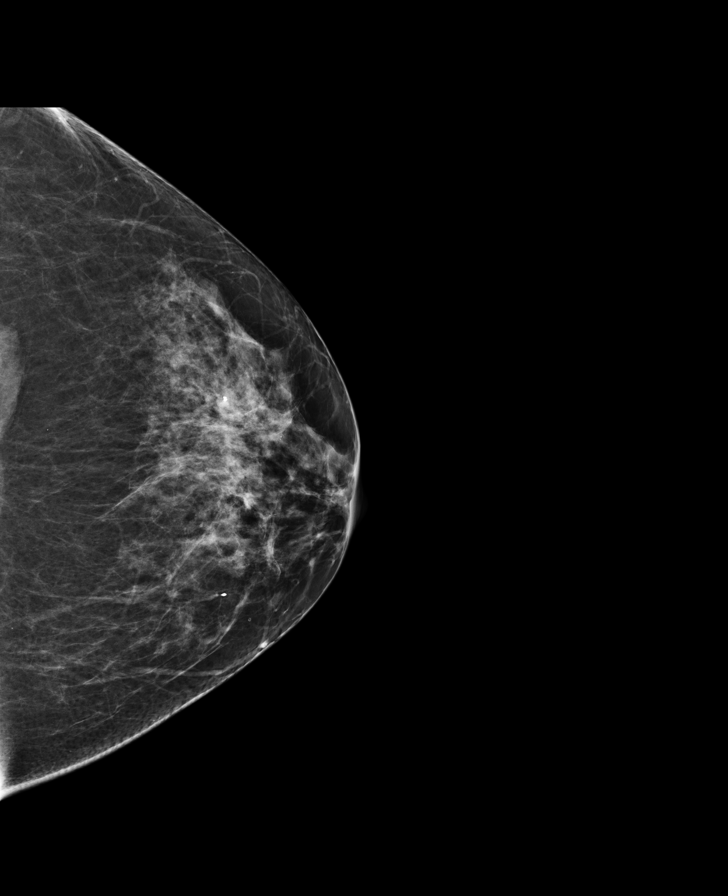

[R MLO]
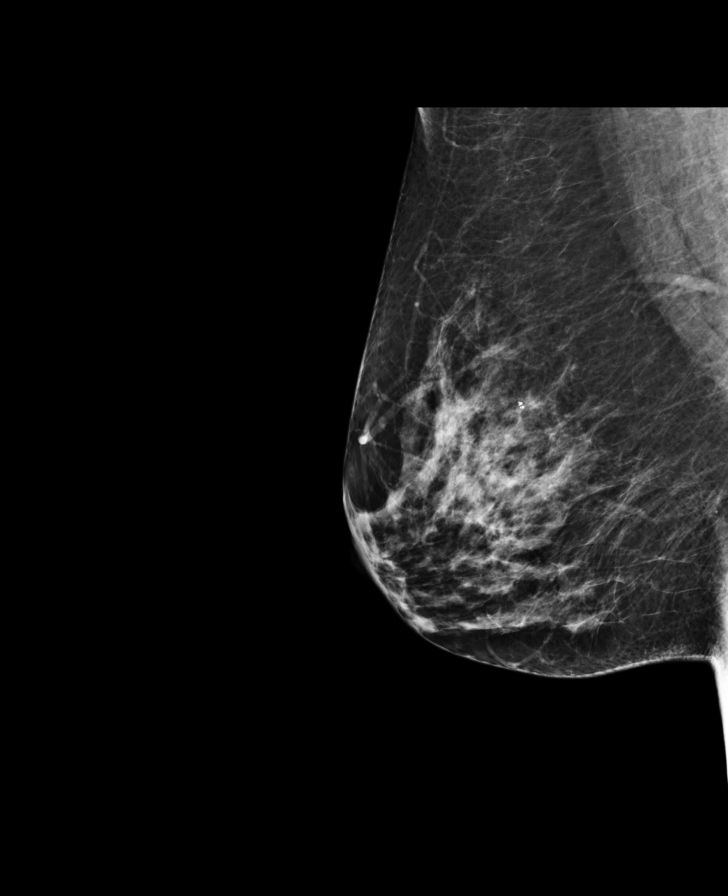

[L MLO]
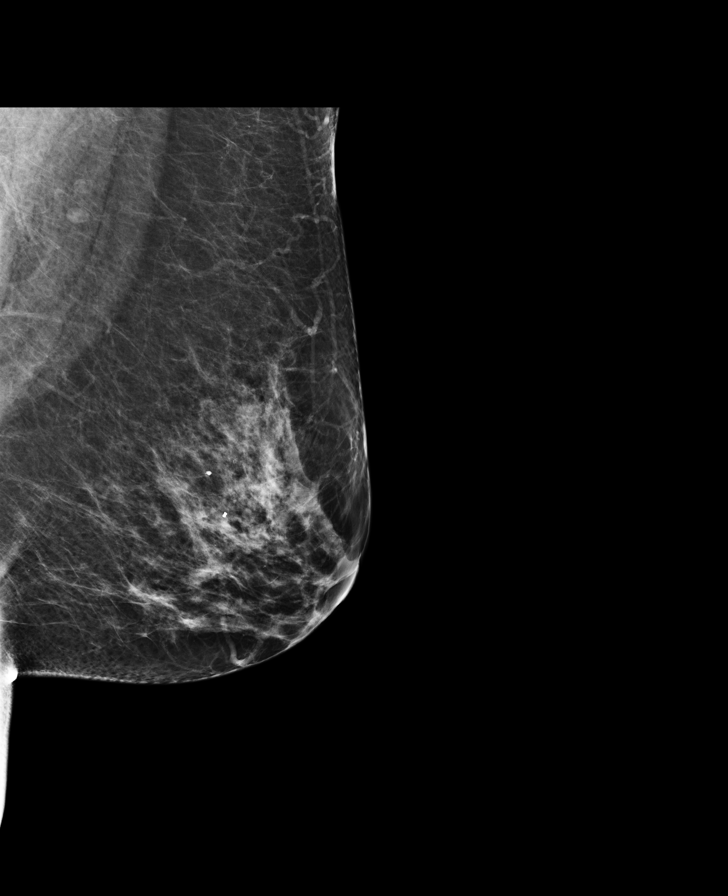

[R MLO tomo · tomo slice 43/85.0]
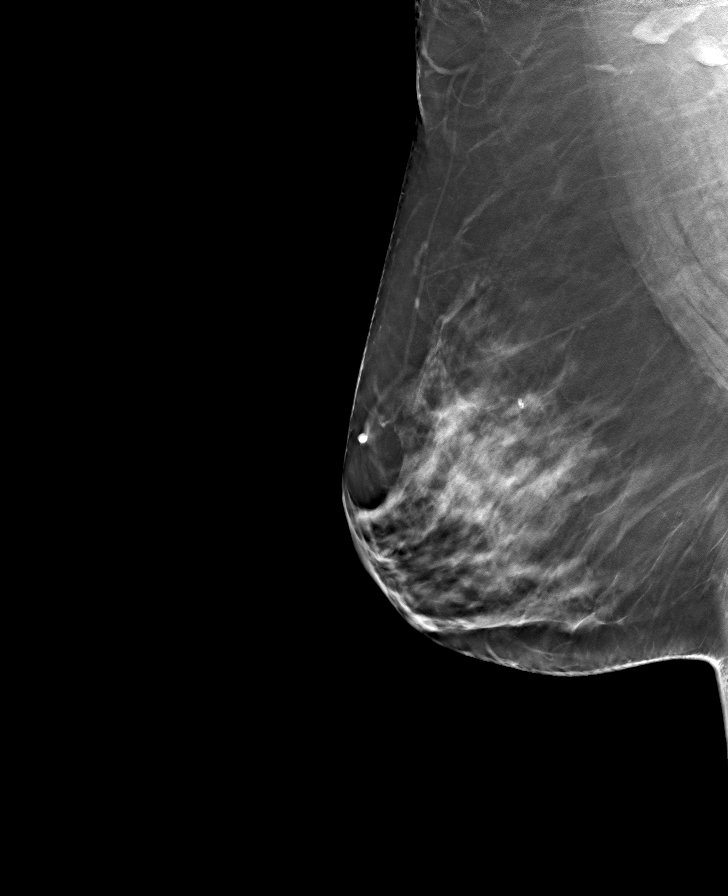

[L CC tomo · tomo slice 39/77.0]
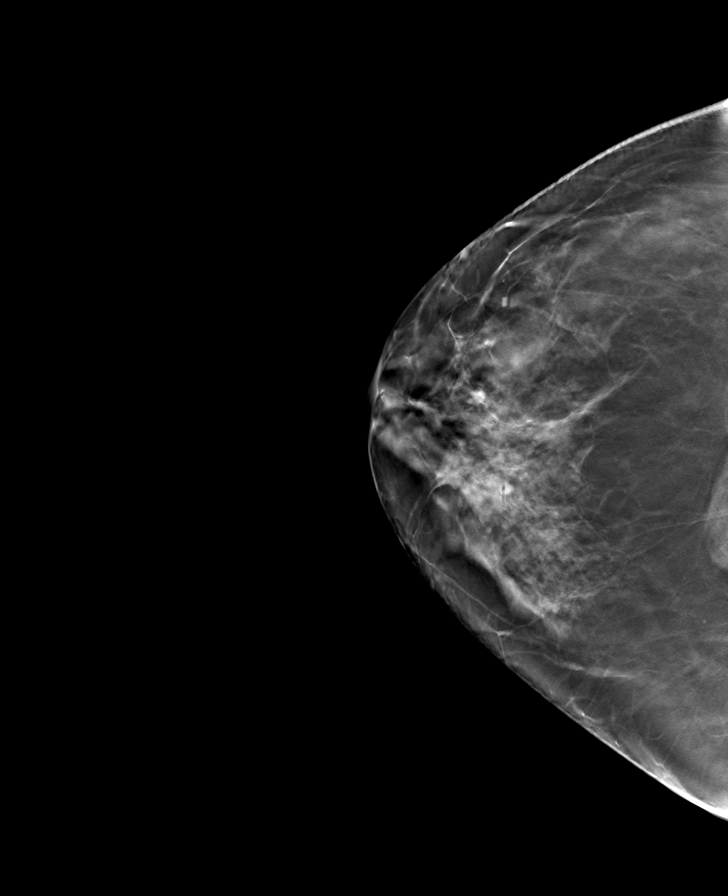

[L MLO tomo · tomo slice 45/90.0]
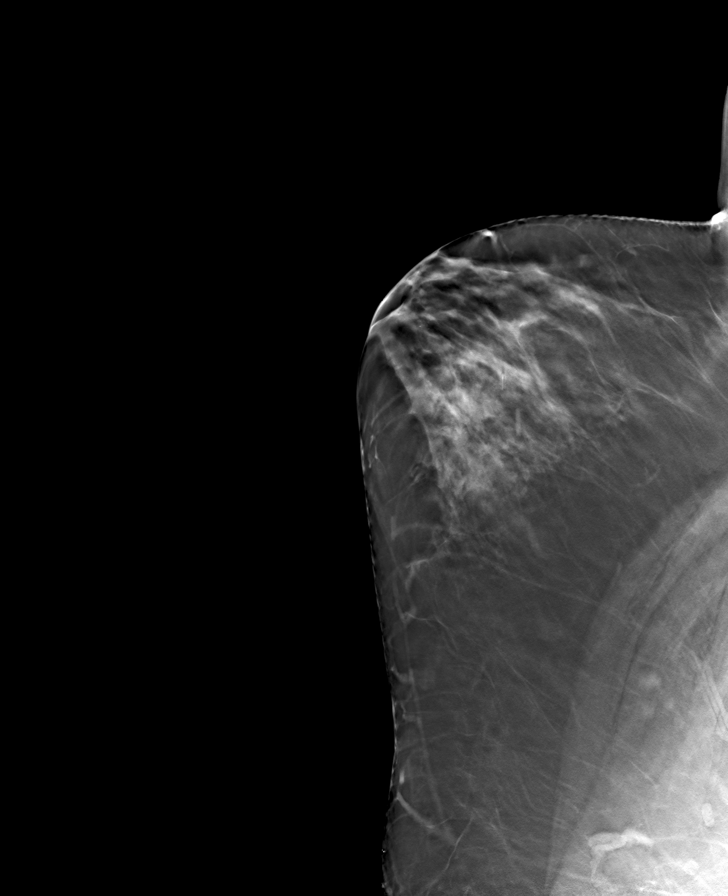

[R CC tomo · tomo slice 37/73.0]
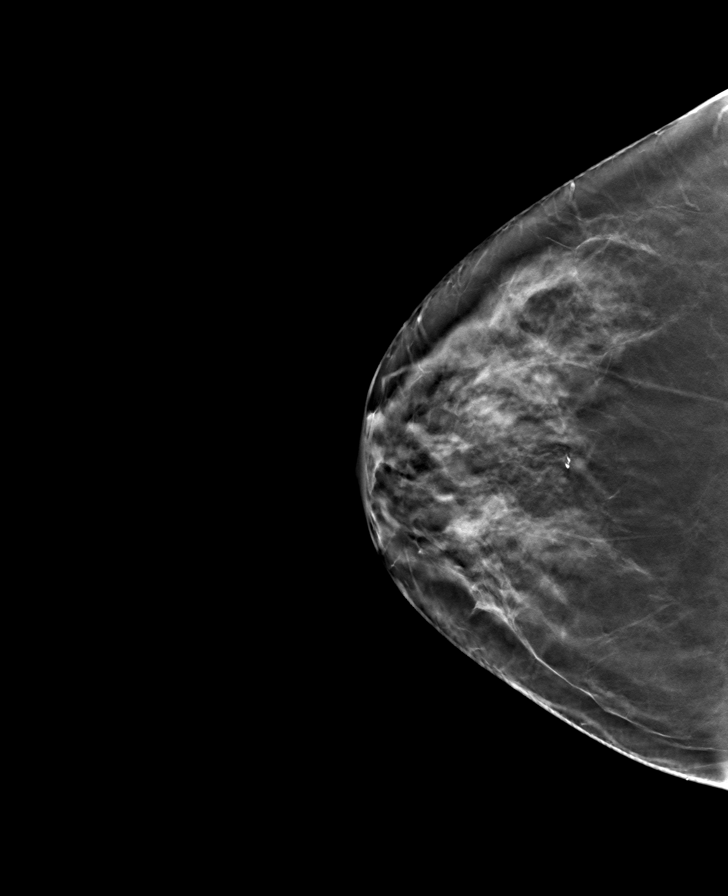

[8 of 24 positions shown; findings below may reference images not displayed]

IMPRESSION: NEGATIVE
There is no mammographic evidence of malignancy. A 1 year screening mammogram is recommended.
mc/penrad:08/31/2016 [DATE]
letter sent: Normal
FINAL ASSESSMENT: BI-RADS:Category 1: Negative

## 2017-11-08 IMAGING — MG MAMMO SCRN BIL W/CAD TOMO
8 series · 8 of 24 positions shown · non-contrast
Comparison: none

Images Obtained from [facility]
HISTORY: Patient is 47 years old and is seen for screening. The patient has a history of left biopsy at age 44 - benign. The patient has no personal history of cancer. The patient has no family history of
breast cancer.
FILMS COMPARED:
The present examination has been compared to prior imaging studies.
TECHNIQUE: Bilateral 2-D digital screening mammogram was performed followed by 3-D tomosynthesis.  Current study was also evaluated with a computer aided detection (CAD) system.
MAMMOGRAM FINDINGS:
The breasts are heterogeneously dense, which may obscure small masses.
No suspicious abnormality is seen in either breast.

[R MLO]
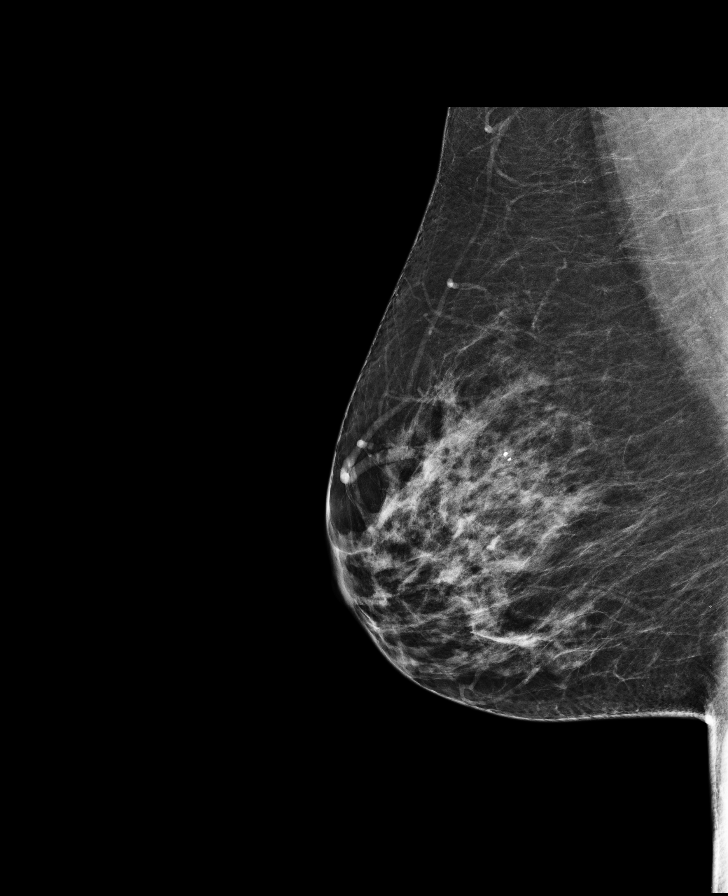

[L MLO]
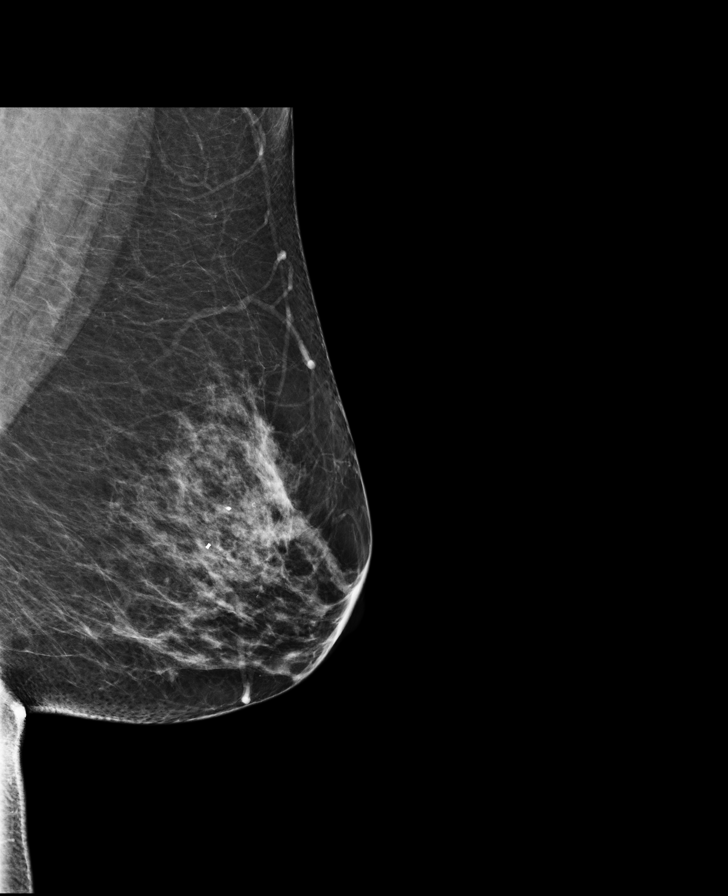

[L CC]
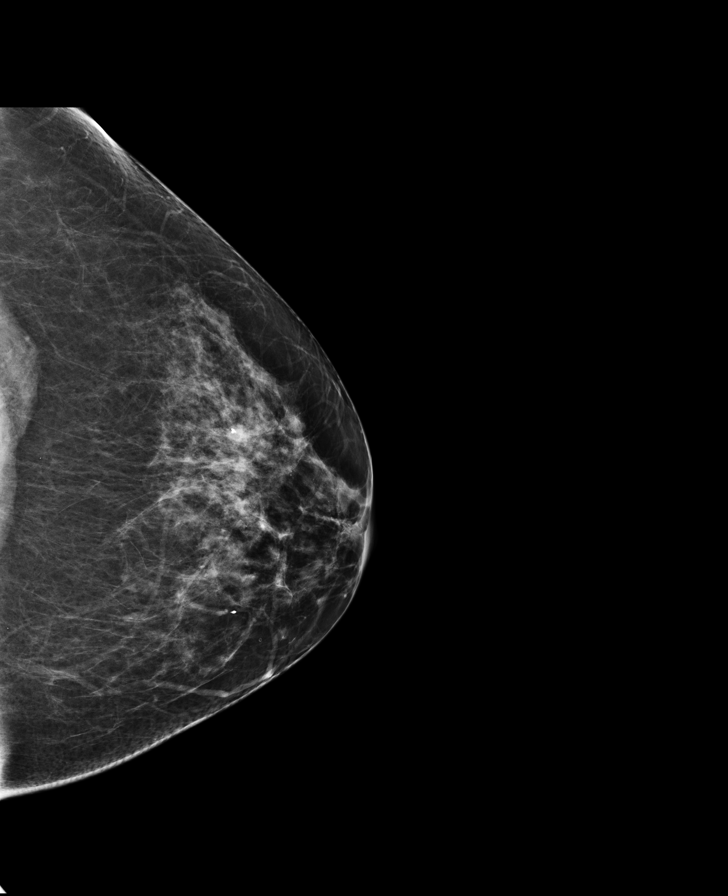

[R CC]
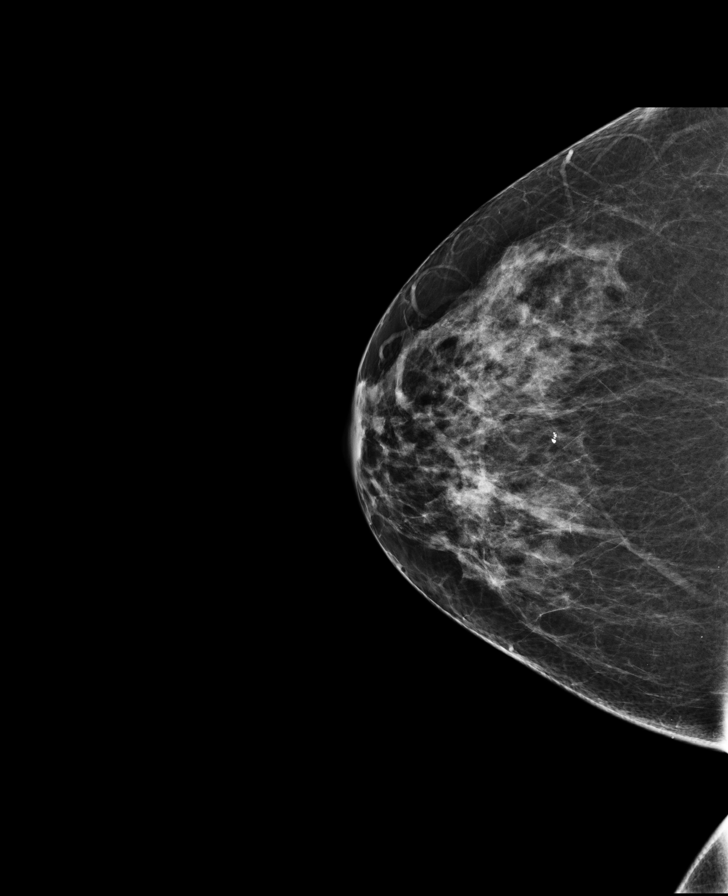

[R MLO tomo · tomo slice 41/82.0]
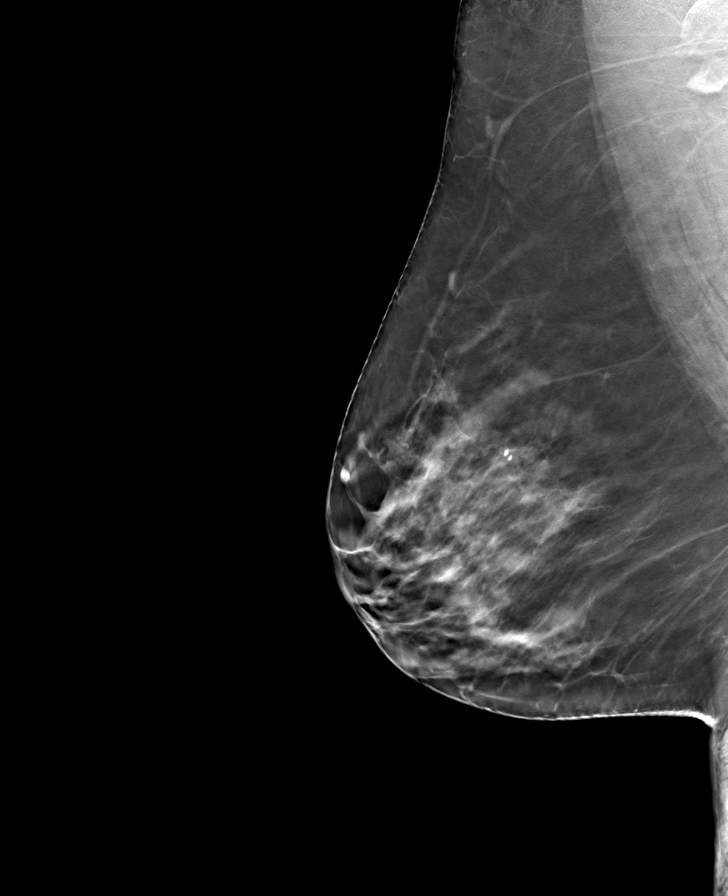

[L MLO tomo · tomo slice 43/86.0]
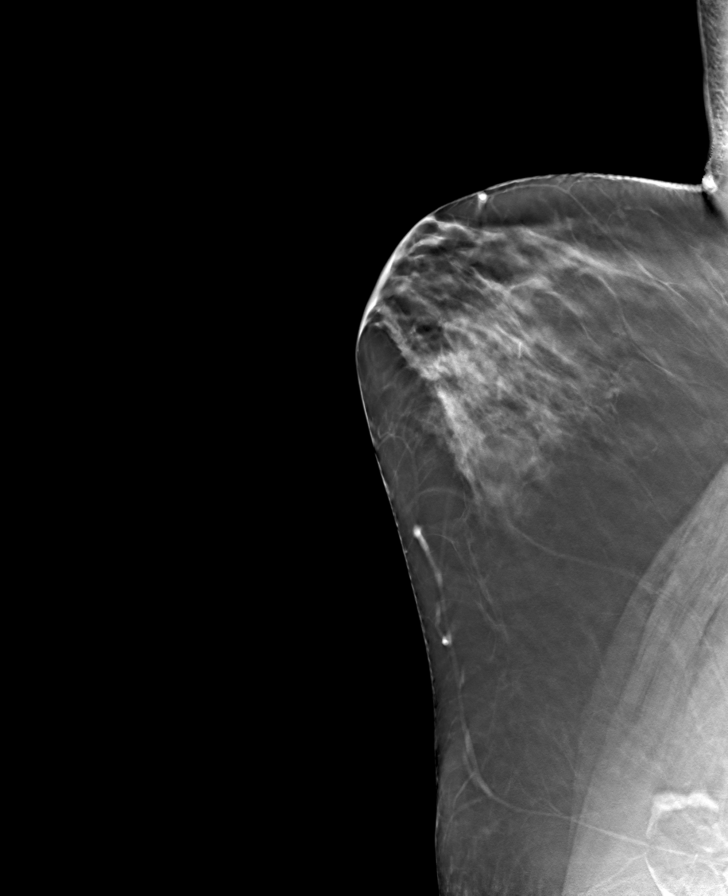

[R CC tomo · tomo slice 41/80.0]
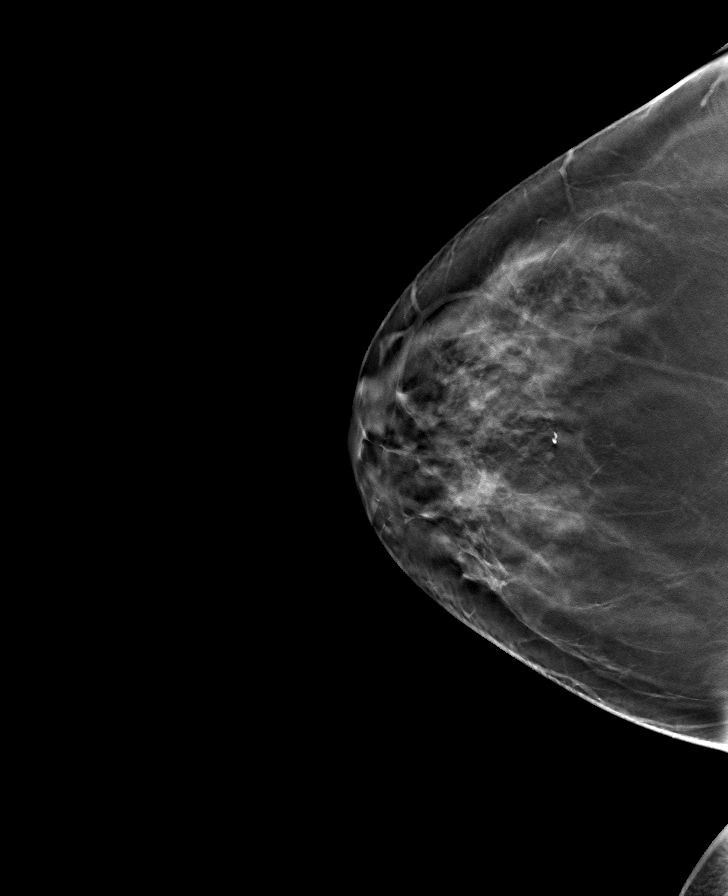

[L CC tomo · tomo slice 43/84.0]
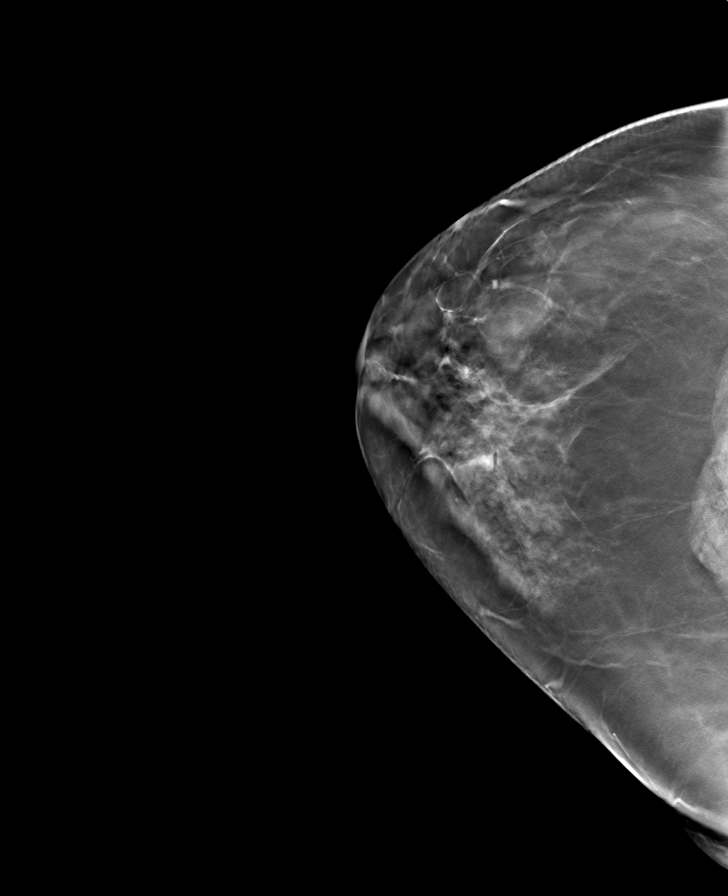

[8 of 24 positions shown; findings below may reference images not displayed]

IMPRESSION: There is no mammographic evidence of malignancy.
Screening mammogram recommended in 1 year.
BI-RADS Category 1: Negative

## 2018-12-13 IMAGING — MG MAMMO SCRN BIL W/CAD TOMO
8 series · 8 of 24 positions shown · non-contrast
Comparison: The present examination has been compared to prior imaging studies.

Images Obtained from [facility]
INDICATION: Screening.
TECHNIQUE: Bilateral 2-D digital screening mammogram was performed followed by 3-D tomosynthesis.  Current study was also evaluated with a computer aided detection (CAD) system.
MAMMOGRAM FINDINGS:
The breasts are heterogeneously dense, which may obscure small masses.
No suspicious abnormality is seen in either breast.

[L CC]
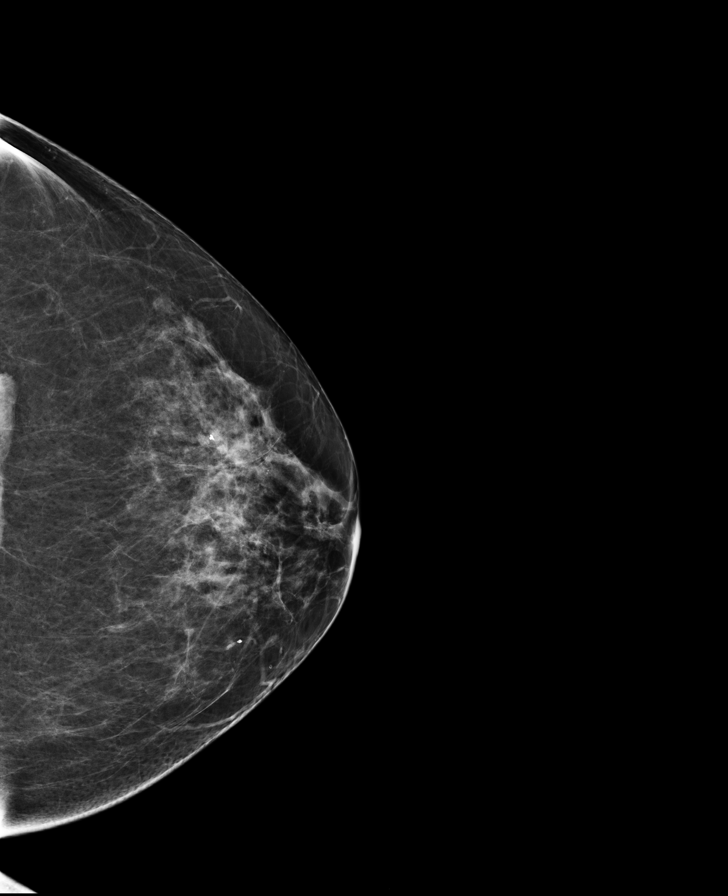

[R MLO]
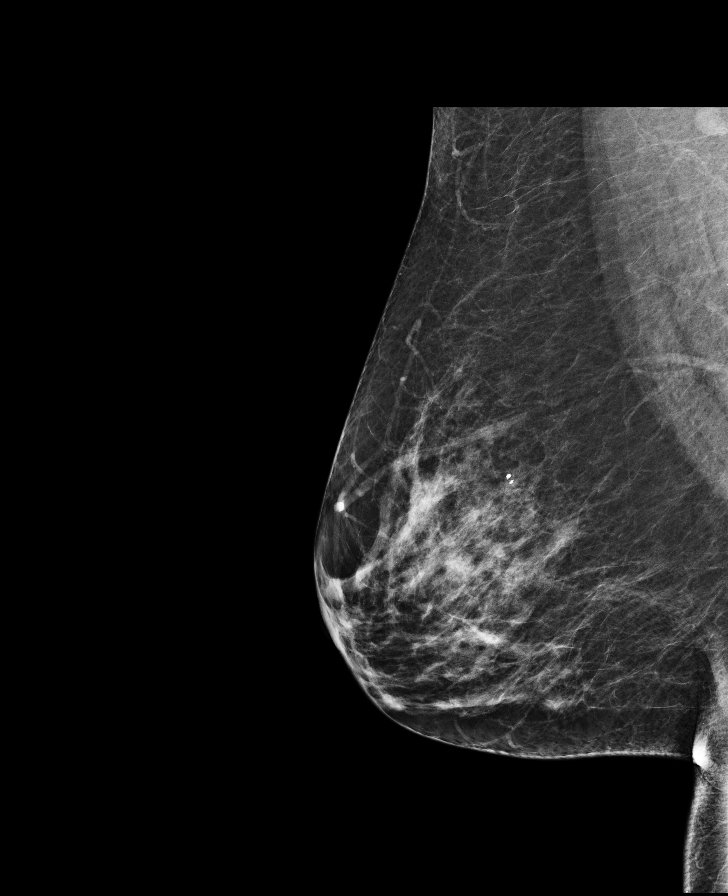

[L MLO]
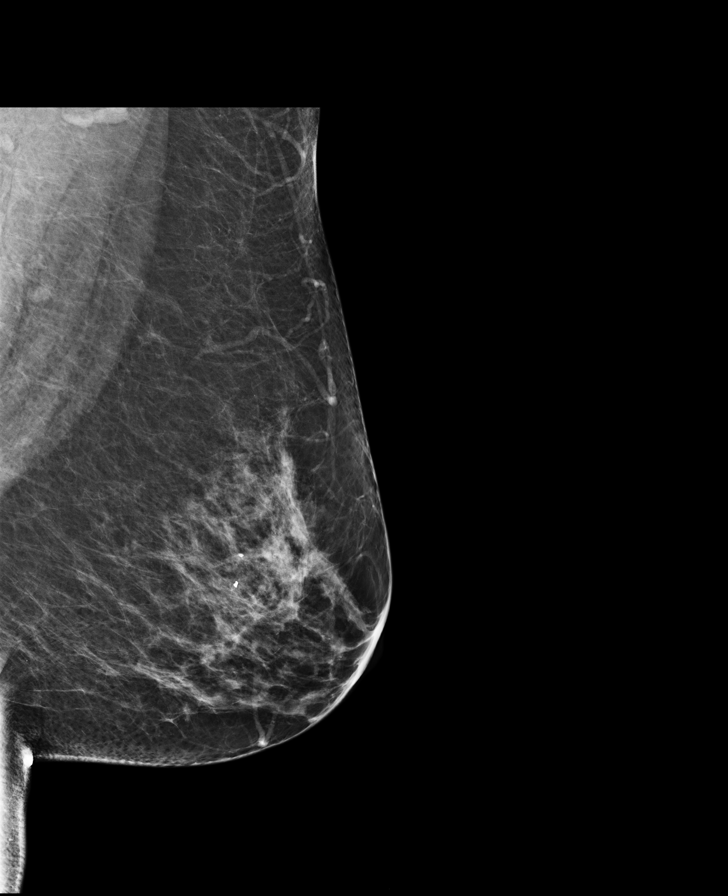

[R CC]
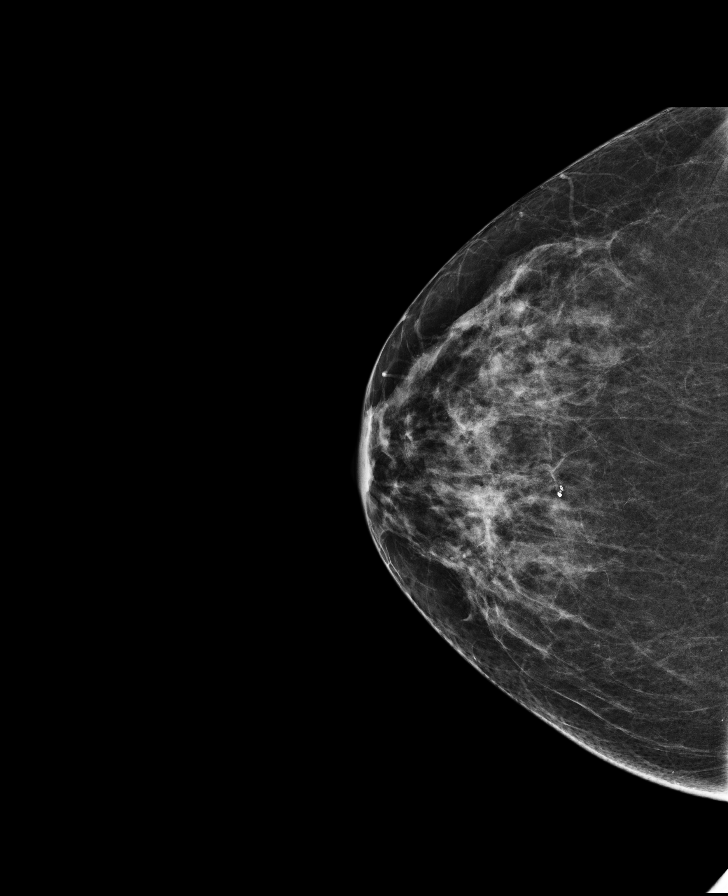

[R CC tomo · tomo slice 37/72.0]
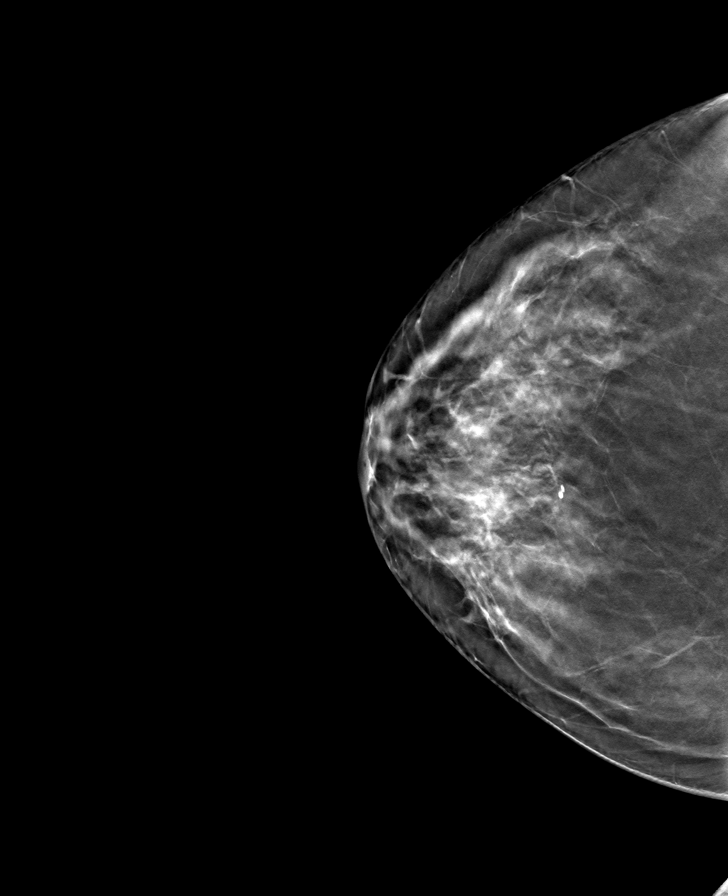

[L CC tomo · tomo slice 41/80.0]
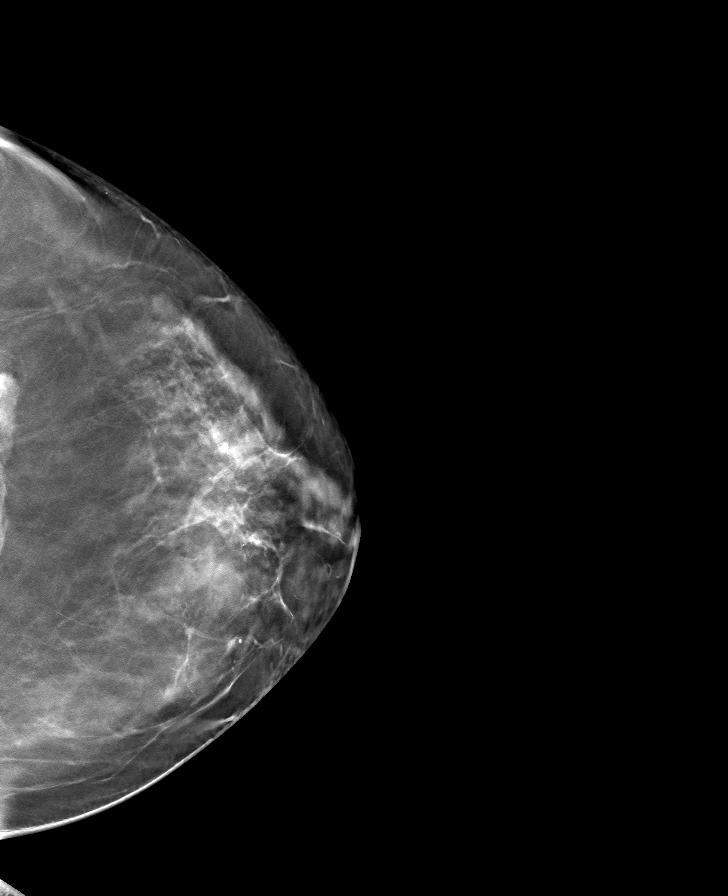

[R MLO tomo · tomo slice 41/82.0]
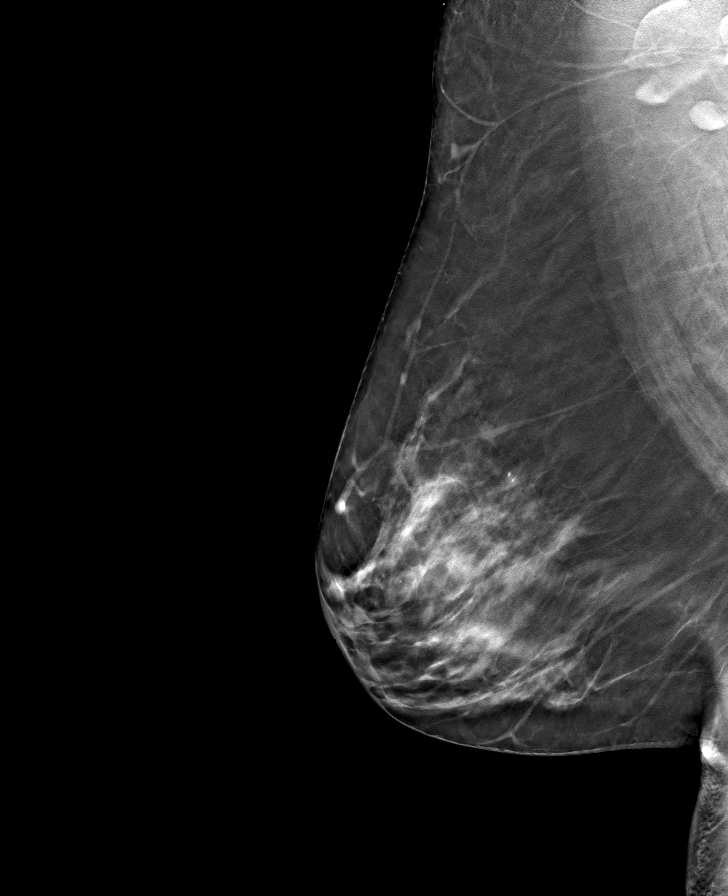

[L MLO tomo · tomo slice 43/86.0]
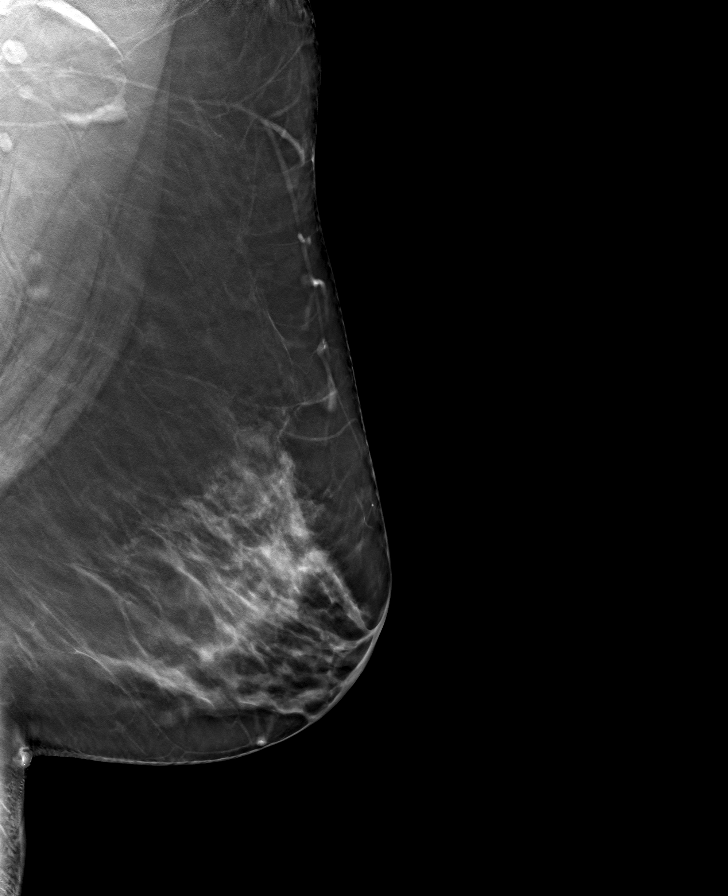

[8 of 24 positions shown; findings below may reference images not displayed]

IMPRESSION: There is no mammographic evidence of malignancy.
Screening mammogram recommended in 1 year.
BI-RADS Category 1: Negative

## 2020-01-02 IMAGING — MG MAMMO SCRN BIL W/CAD TOMO
8 series · 8 of 24 positions shown · non-contrast
Comparison: The present examination has been compared to prior imaging studies.

Images Obtained from [facility]
INDICATION: Screening.
TECHNIQUE: Bilateral 2-D digital screening mammogram was performed followed by 3-D tomosynthesis.  Current study was also evaluated with a computer aided detection (CAD) system.
MAMMOGRAM FINDINGS:
The breasts are heterogeneously dense, which may obscure small masses.
There are benign-type calcifications in both breasts.
No suspicious abnormality is seen in either breast.

[L MLO]
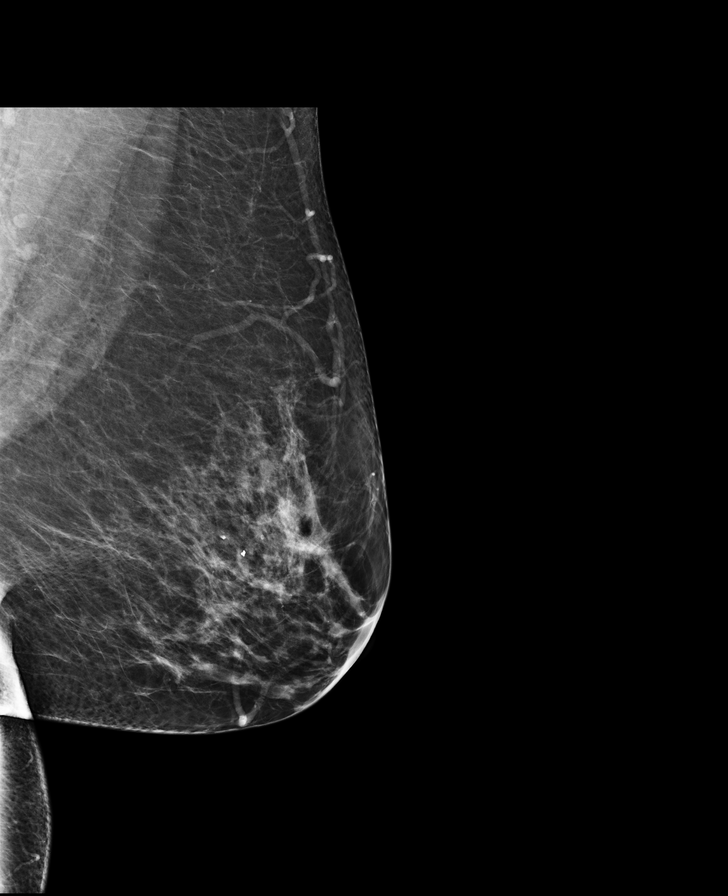

[R CC]
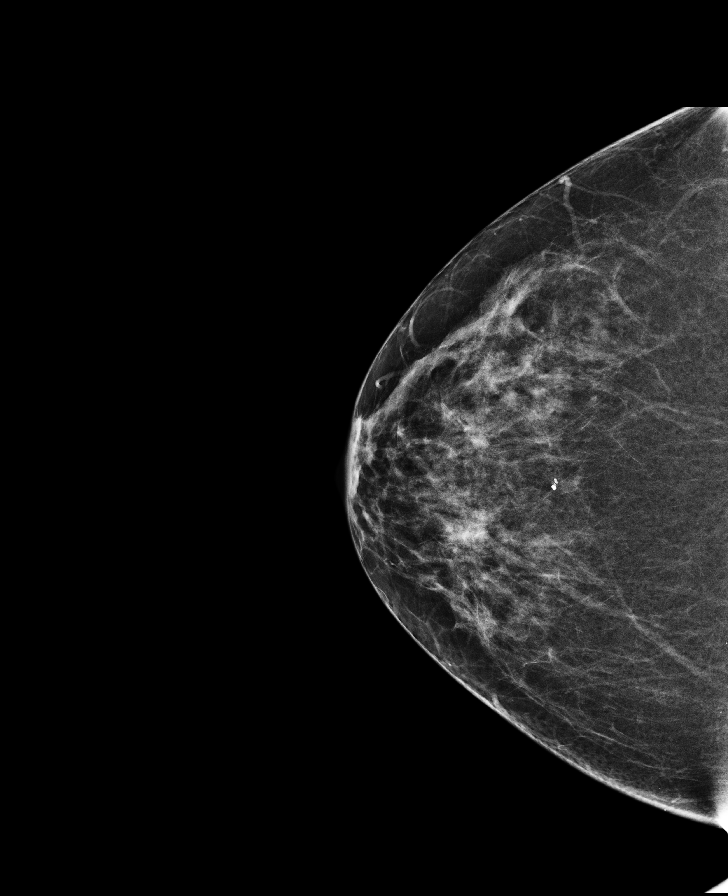

[L CC]
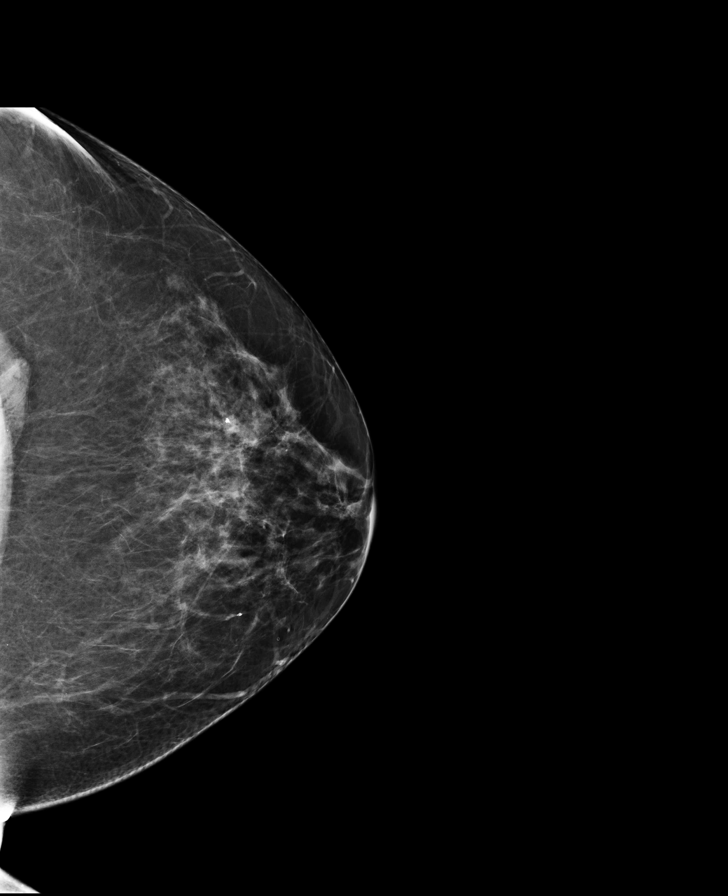

[R MLO]
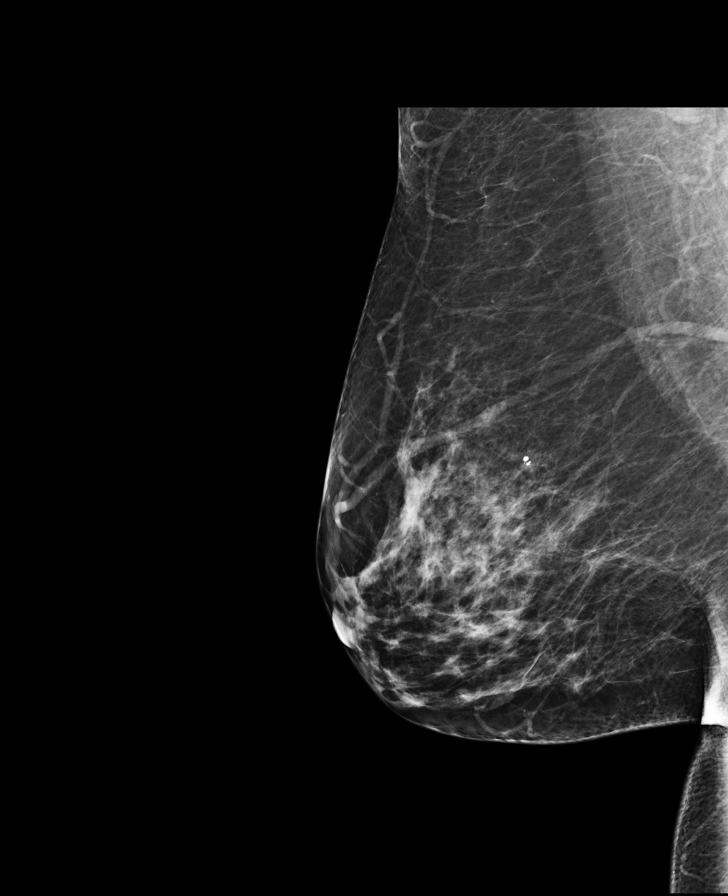

[L MLO tomo · tomo slice 45/90.0]
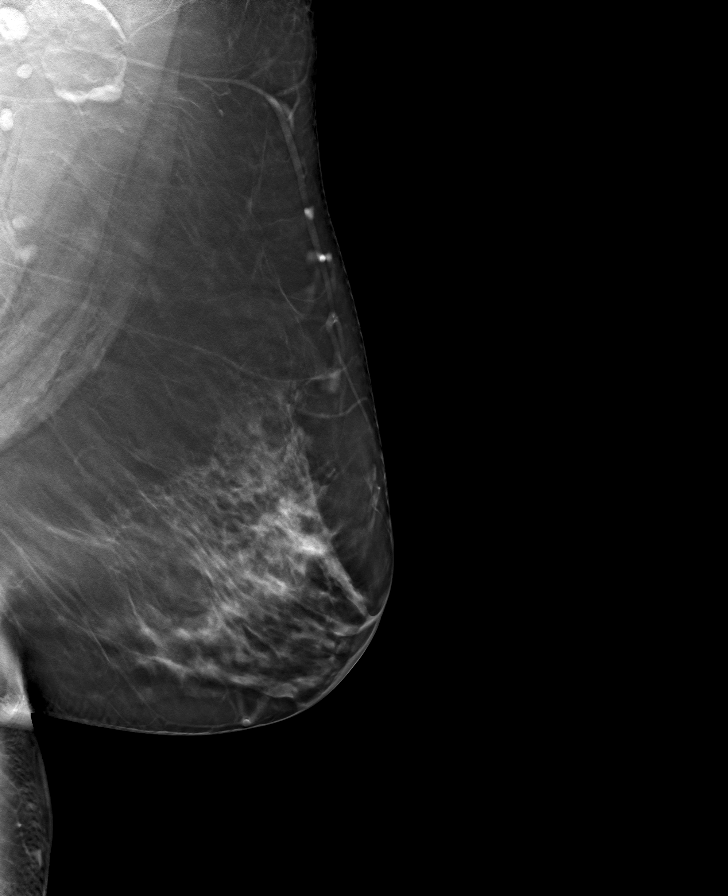

[R MLO tomo · tomo slice 45/90.0]
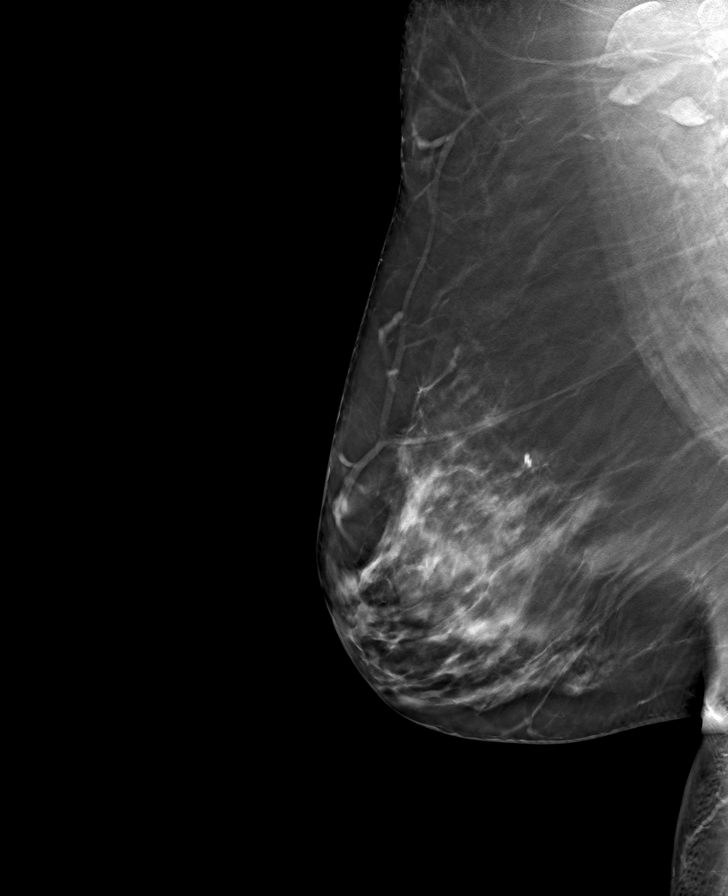

[R CC tomo · tomo slice 38/75.0]
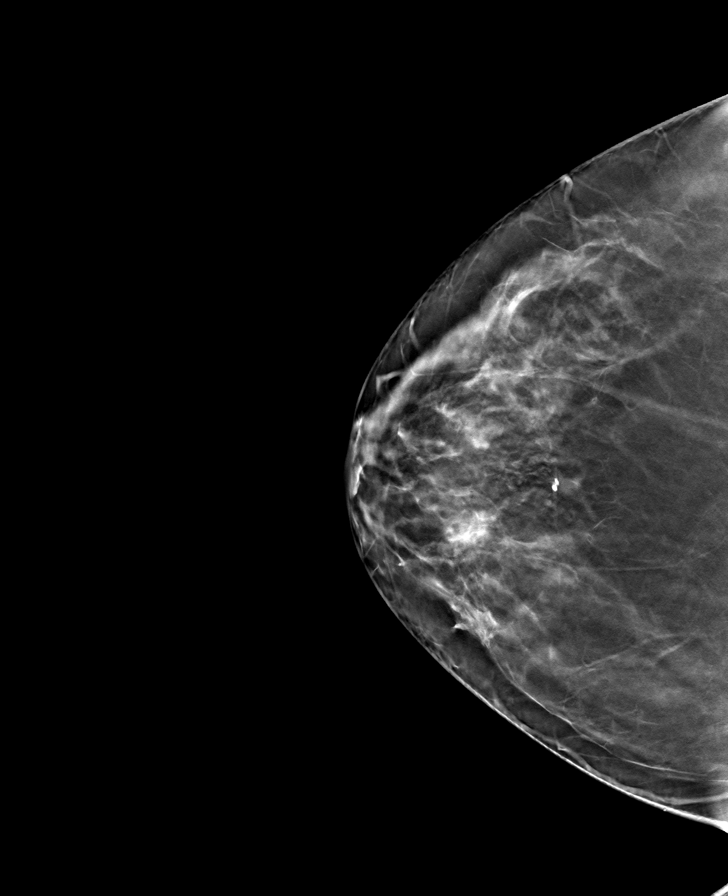

[L CC tomo · tomo slice 44/87.0]
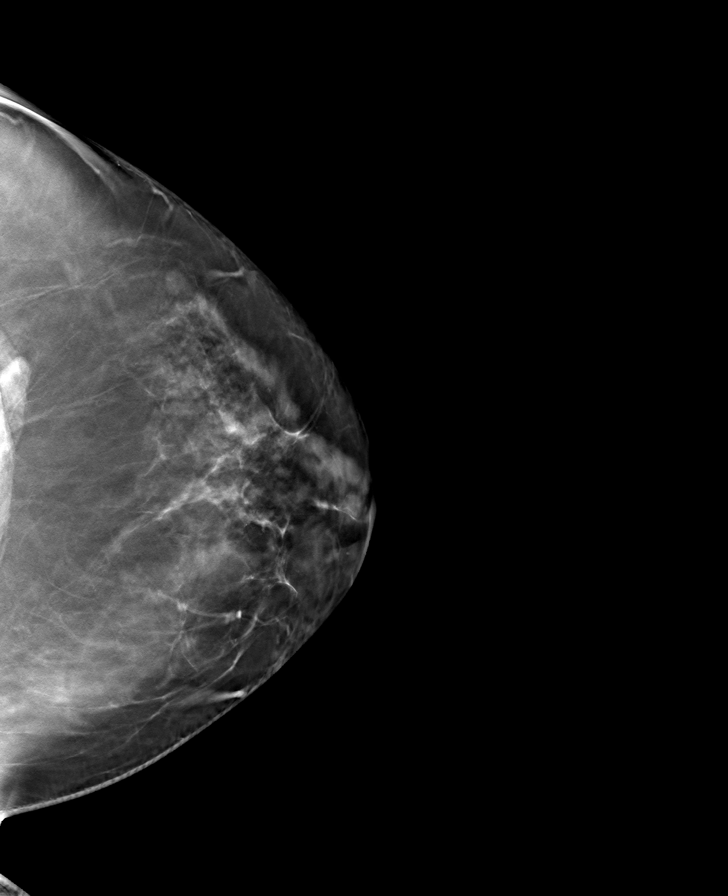

[8 of 24 positions shown; findings below may reference images not displayed]

IMPRESSION: There is no mammographic evidence of malignancy.
Screening mammogram recommended in 1 year.
BI-RADS Category 2: Benign

## 2021-01-21 IMAGING — MG MAMMO SCRN BIL W/CAD TOMO
8 series · 8 of 24 positions shown · non-contrast
Comparison: The present examination has been compared to prior imaging studies.

Images Obtained from [facility]
INDICATION: Screening.
TECHNIQUE: Bilateral 2-D digital screening mammogram was performed followed by 3-D tomosynthesis.  Current study was also evaluated with a computer aided detection (CAD) system.
MAMMOGRAM FINDINGS:
The breasts are heterogeneously dense, which may obscure small masses.
No suspicious abnormality is seen in either breast.  There are no significant changes from the prior study.

[L MLO]
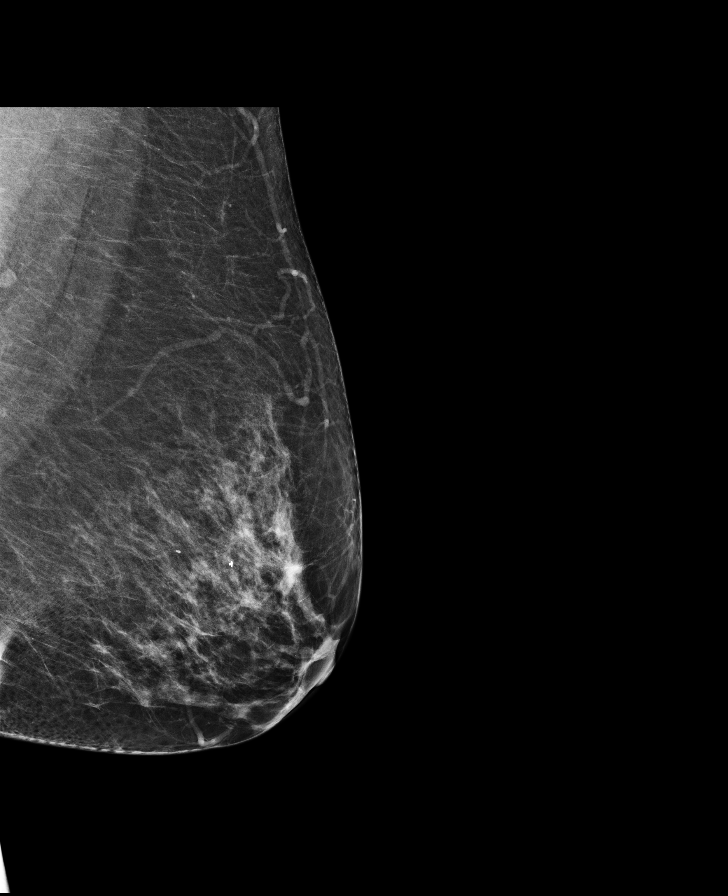

[L CC]
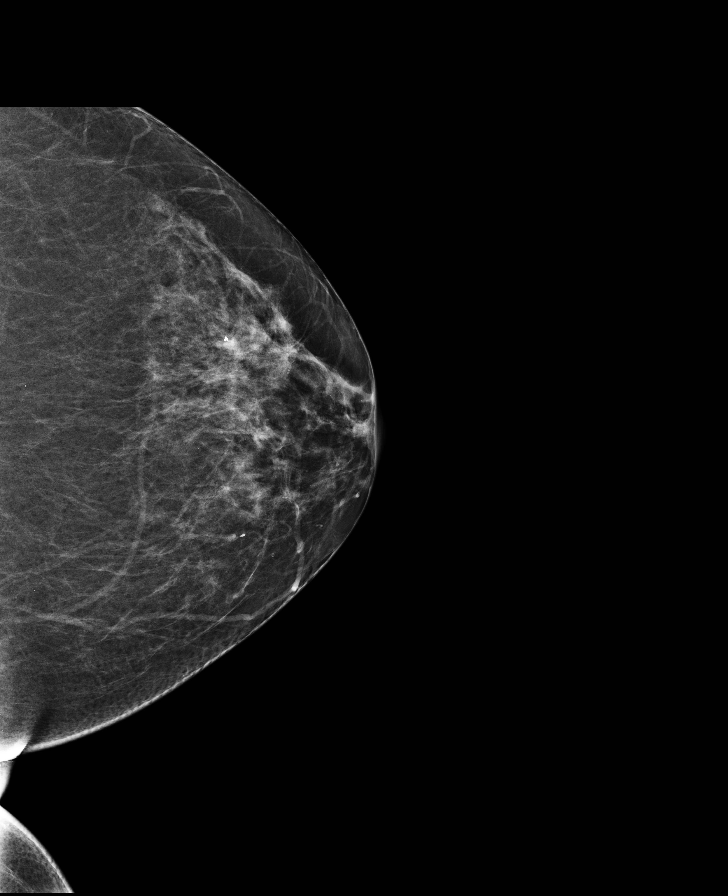

[R MLO]
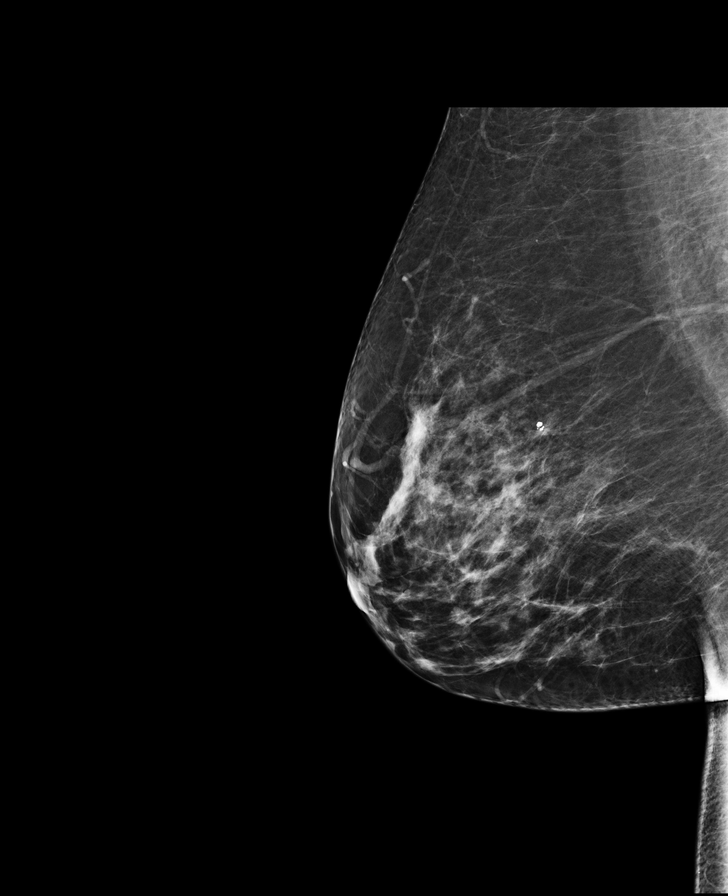

[R CC]
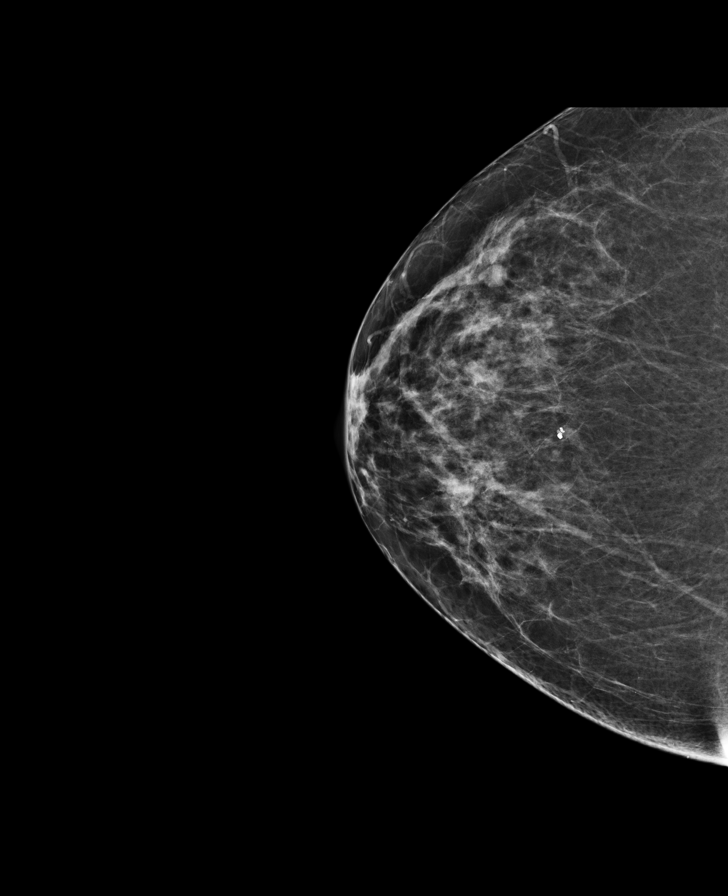

[L CC tomo · tomo slice 39/76.0]
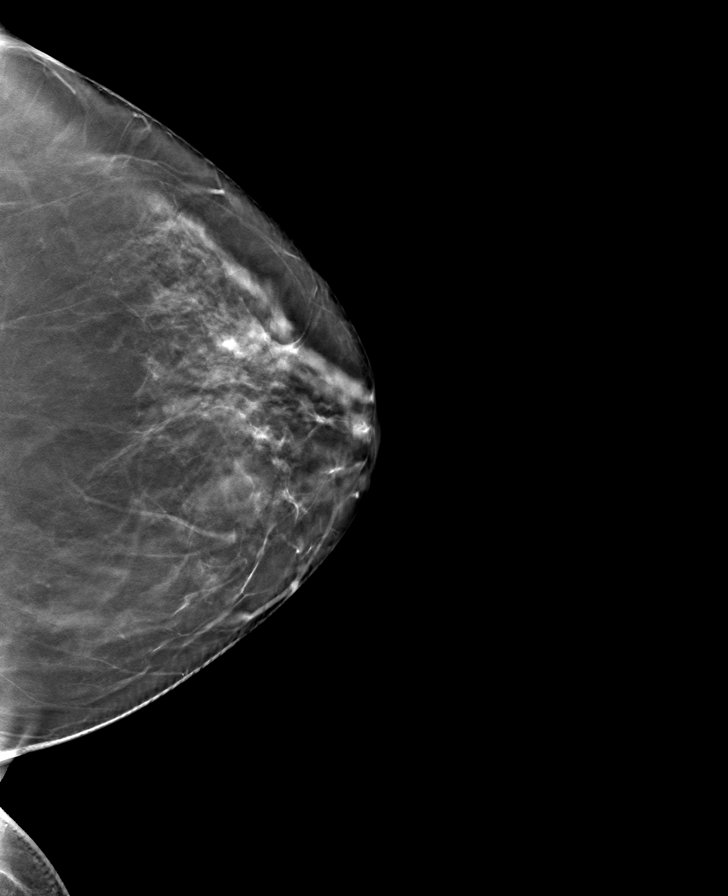

[R CC tomo · tomo slice 37/73.0]
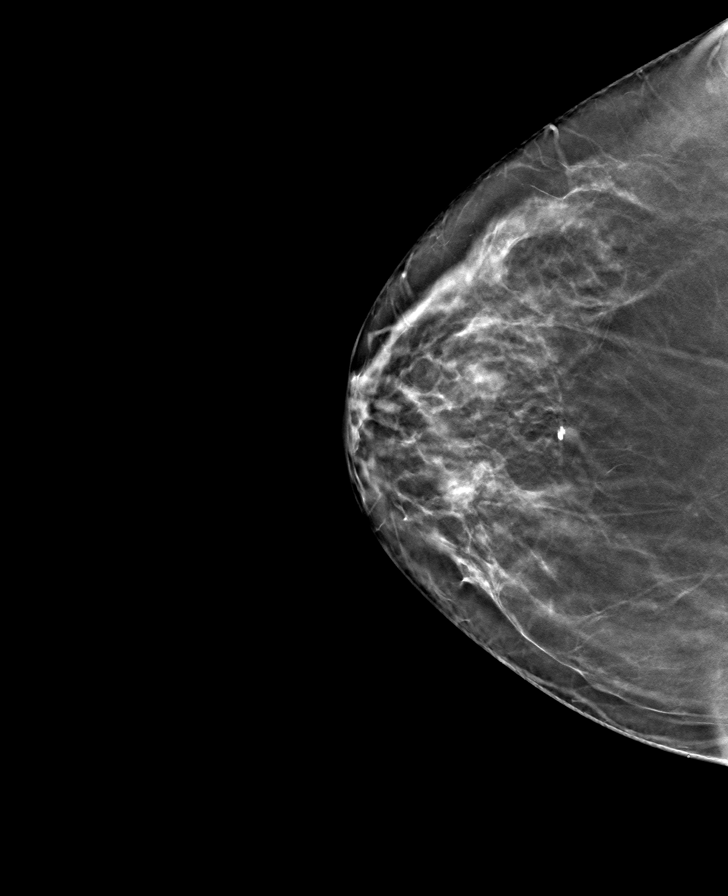

[R MLO tomo · tomo slice 43/86.0]
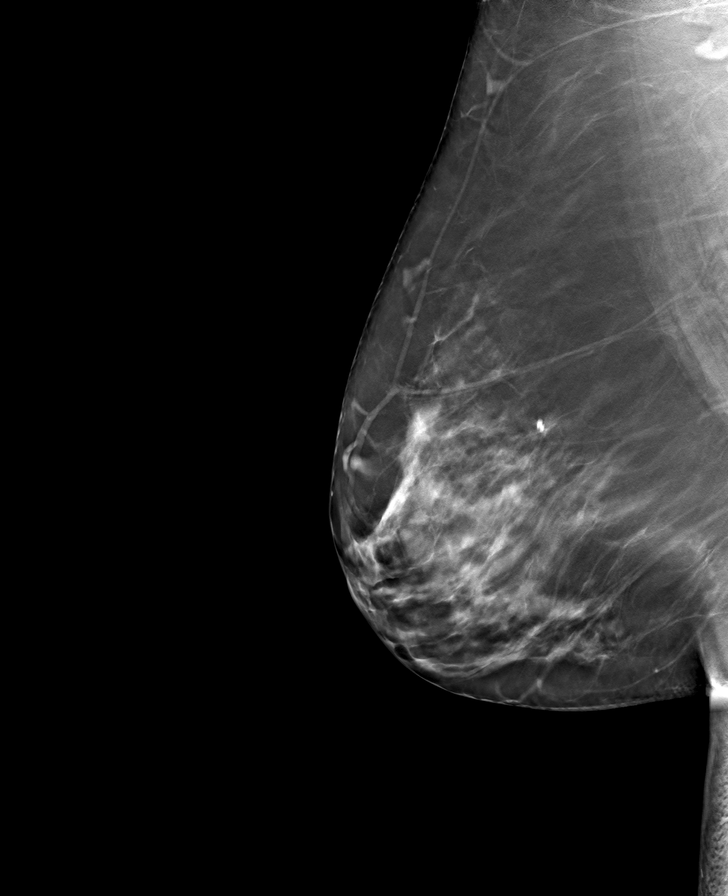

[L MLO tomo · tomo slice 44/87.0]
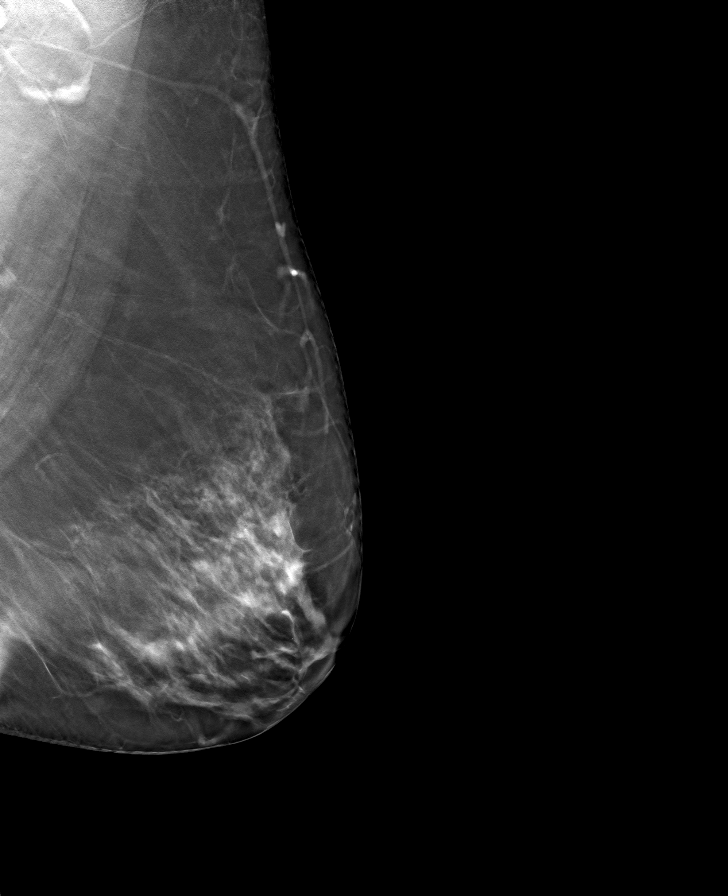

[8 of 24 positions shown; findings below may reference images not displayed]

IMPRESSION: There is no mammographic evidence of malignancy.
Screening mammogram recommended in 1 year.
BI-RADS Category 1: Negative

## 2021-06-19 IMAGING — MR MRA HEAD WO CONTRAST
3 series · 28 of 48 positions shown · non-contrast
Comparison: None

REASON FOR EXAM: 50-year-old Female with transient cerebral ischemic attack .
TECHNIQUE: Multiplanar, multisequence MR angiographic images of the head were acquired without the administration of IV contrast.  3-D rotating MIP images were generated with post-processing software performed under concurrent physician supervision.

[Series 5: t1_sag · sagittal · 5.0mm · 0.72mm/px · 6 of 25 slices shown]
[im 1/25]
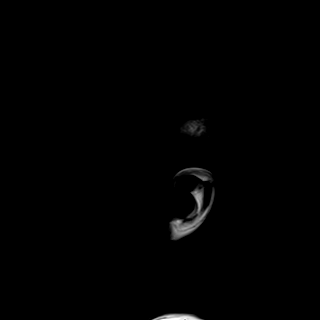
[im 5/25]
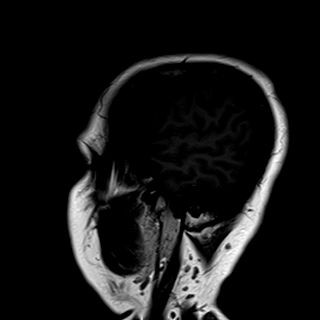
[im 10/25]
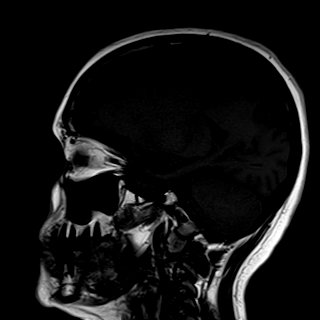
[im 15/25]
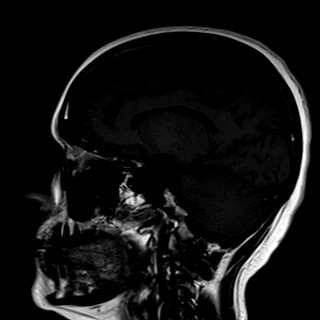
[im 20/25]
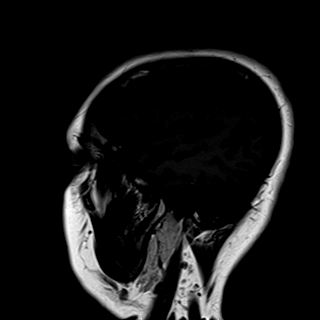
[im 25/25]
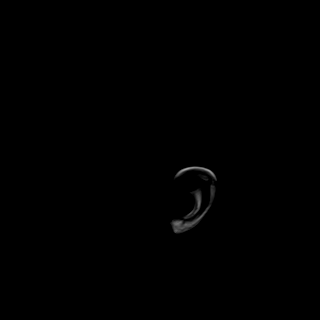

[Series 6: tof_fl3d_tra_large · axial · 0.5mm · 0.41mm/px · z∈[-44,+37]mm · 16 of 172 slices shown]
[im 1/172]
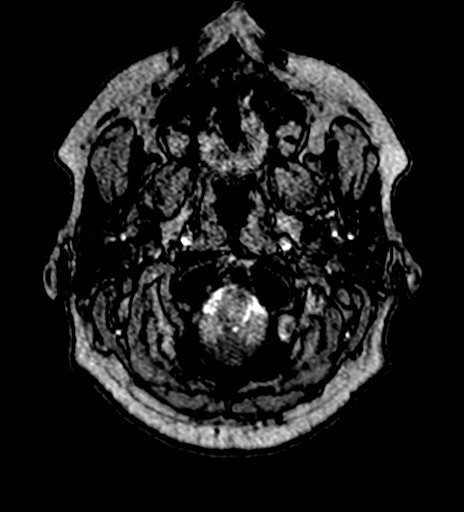
[im 5/172]
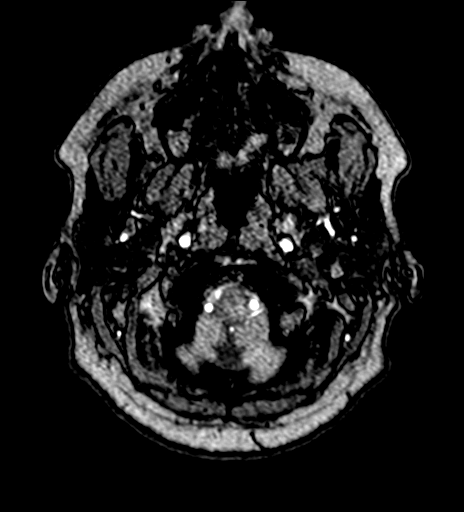
[im 10/172]
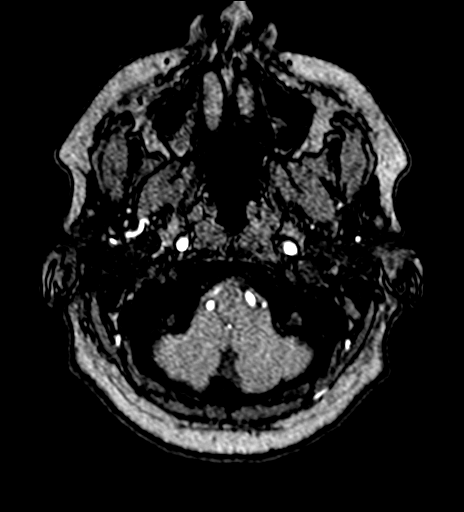
[im 15/172]
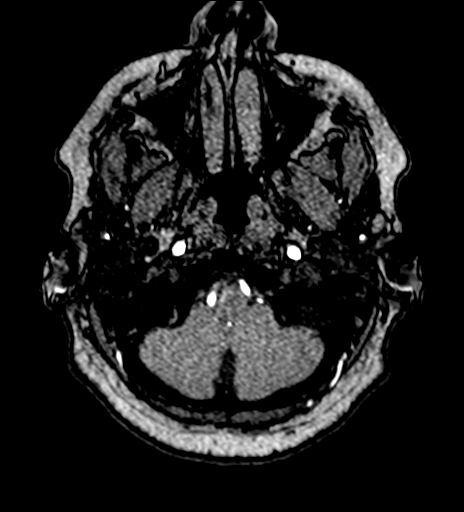
[im 20/172]
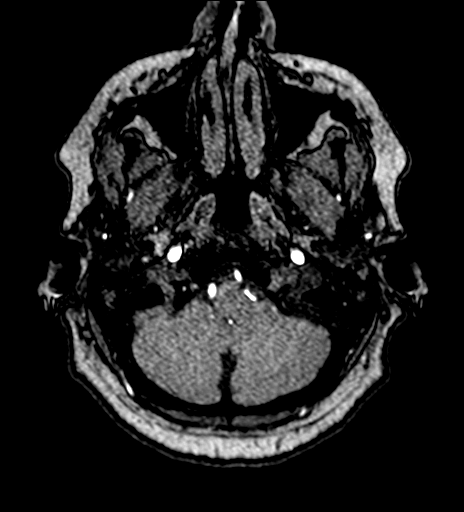
[im 25/172]
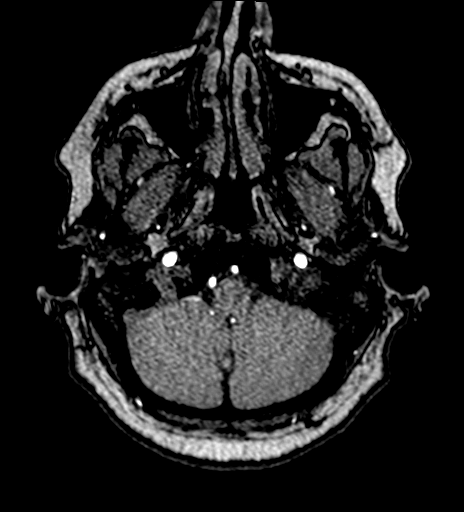
[im 30/172]
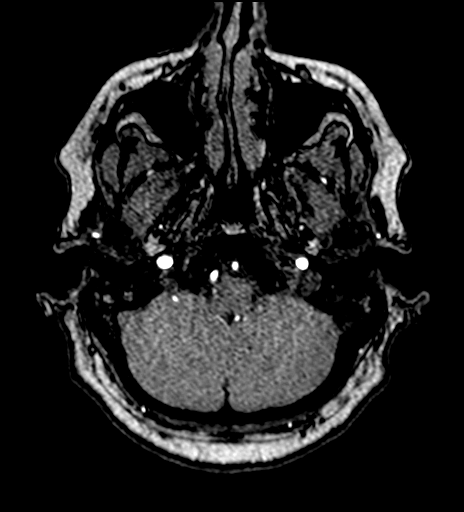
[im 35/172]
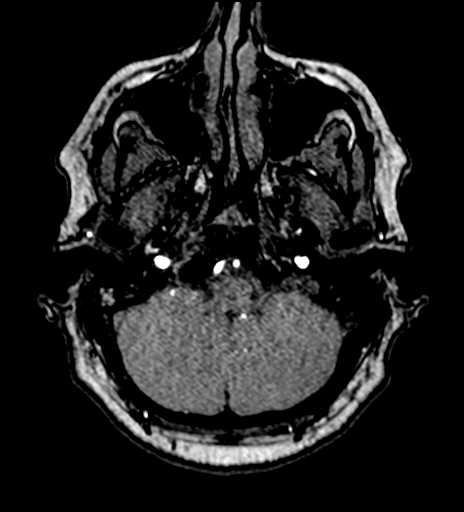
[im 54/172]
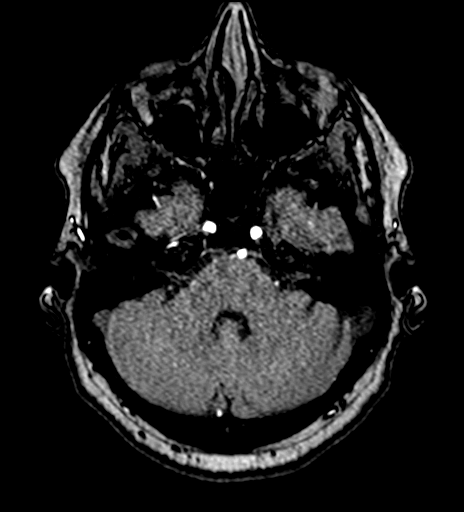
[im 74/172]
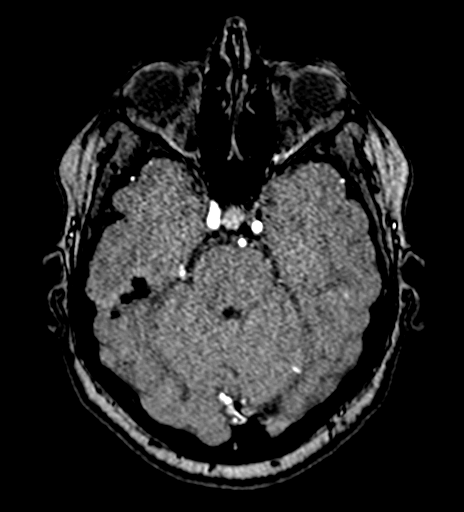
[im 88/172]
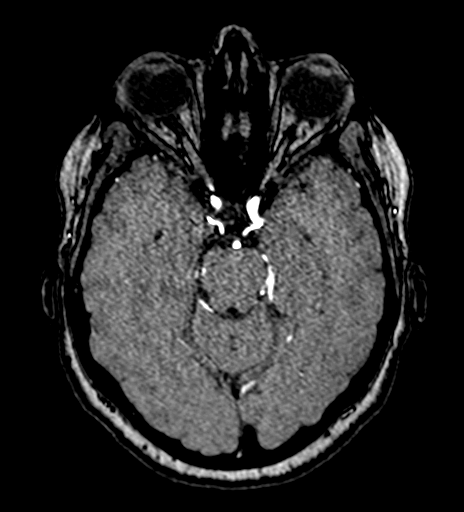
[im 98/172]
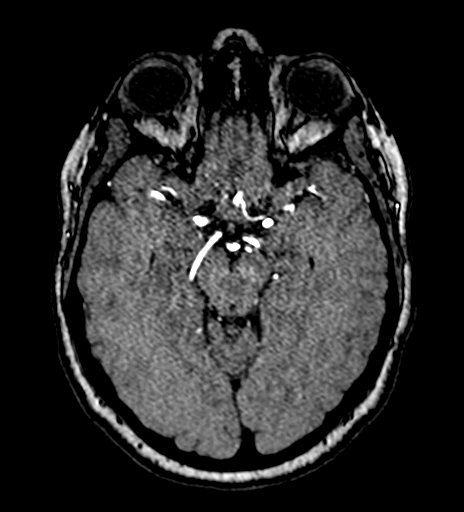
[im 118/172]
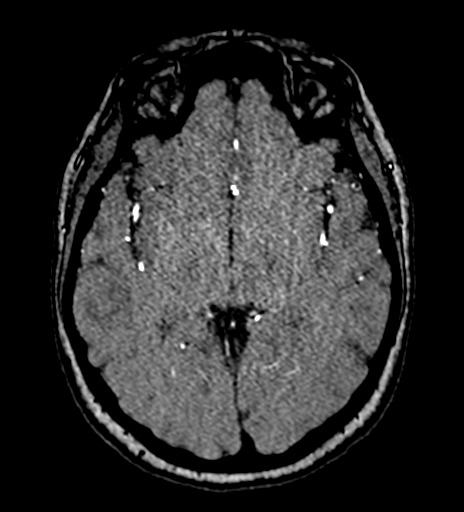
[im 142/172]
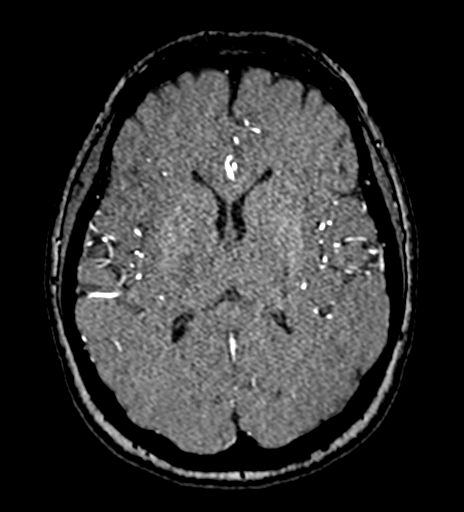
[im 147/172]
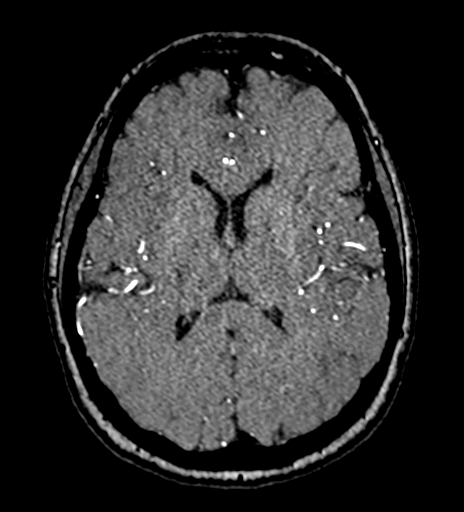
[im 162/172]
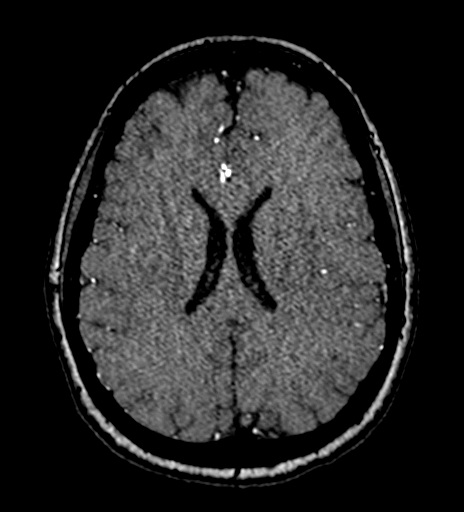

[Series 11: t1_axial fs · axial · 4.0mm · 0.72mm/px · z∈[-74,+74]mm · 6 of 30 slices shown]
[im 1/30]
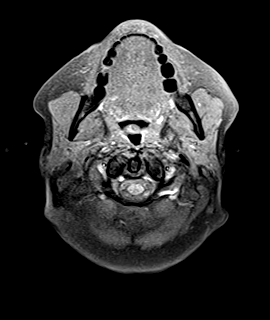
[im 6/30]
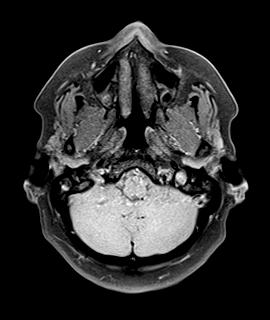
[im 12/30]
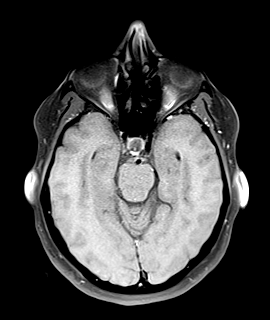
[im 18/30]
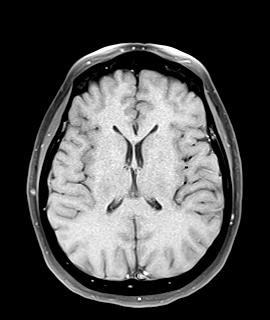
[im 24/30]
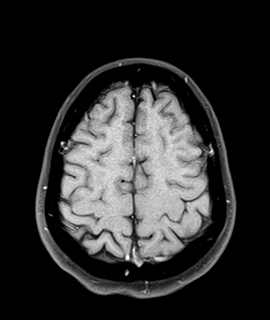
[im 30/30]
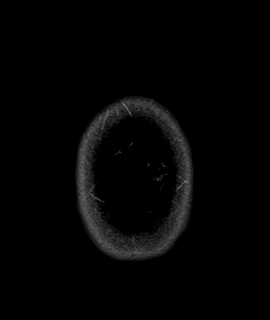

[28 of 48 positions shown; findings below may reference images not displayed]

FINDINGS: Anterior:  No significant stenosis in the bilateral intracranial ICA, MCA and ACA.

Posterior:  No significant stenosis in the bilateral intracranial vertebral arteries and basilar artery. Unremarkable bilateral PCA.

2 x 4 mm aneurysm arising from the clinoid segment of the left ICA with the apex projecting inferiorly and medially ([DATE]).
IMPRESSION: 1.
No significant stenosis in the intracranial circulation.

2.
2 x 4 mm aneurysm arising from the clinoid segment of the left ICA.

ABNORMAL FINDINGS!

Stat fax

## 2021-06-19 IMAGING — MR MRA NECK WO/W CONTRAST
7 series · 48 of 48 positions shown · IV contrast (gadolinium)
Comparison: MRA head 06/19/21

INDICATION: 50-year-old Female with transient cerebral ischemic attack .
TECHNIQUE: MRA of the neck without and with intravenous contrast. Two-dimension time-of-flight MR angiography of the carotid bifurcations is performed without IV contrast.  This is followed by a 3D gadolinium bolus enhanced MR angiogram of the carotid arteries using 20 cc IV Prohance. 2D reconstruction including coronal and sagittal images and 3D reconstruction using MIP and volume rendering algorithms are performed under concurrent physician supervision.

[Series 2: tof_(person_name)2(person_name)_(person_name) · axial · 3.0mm · 0.86mm/px · z∈[-188,-53]mm · 6 of 70 slices shown]
[im 1/70]
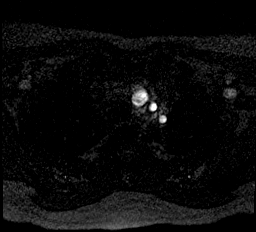
[im 14/70]
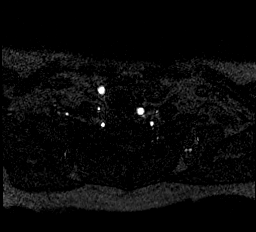
[im 28/70]
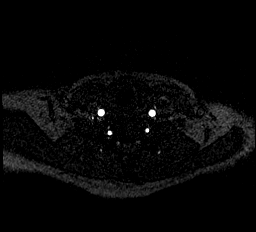
[im 42/70]
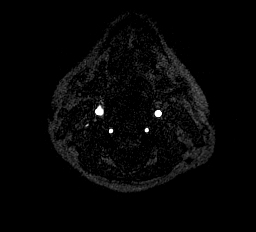
[im 56/70]
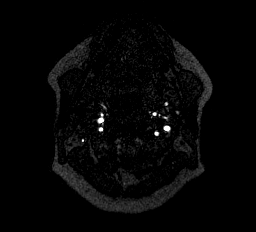
[im 70/70]
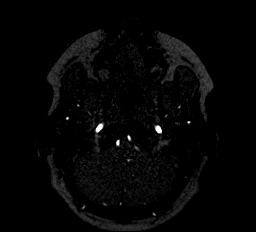

[Series 7: t1_vibe_(person_name)_axial_w · axial · 2.0mm · 0.89mm/px · z∈[-197,-43]mm · 7 of 80 slices shown]
[im 1/80]
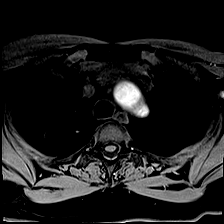
[im 14/80]
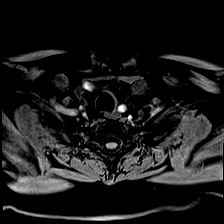
[im 27/80]
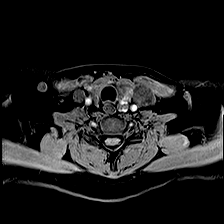
[im 40/80]
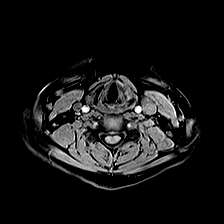
[im 53/80]
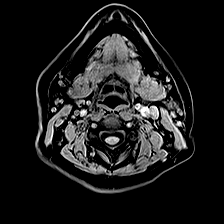
[im 66/80]
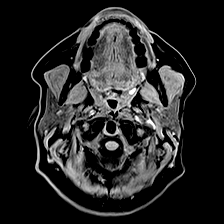
[im 80/80]
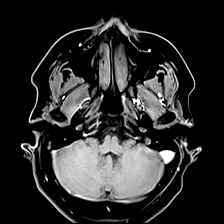

[Series 11: fl3d_ce_cor_+c_ttc=2.0s_moco-adv · coronal · 1.0mm · 0.94mm/px · 7 of 80 slices shown (1 of 2)]
[im 1/80]
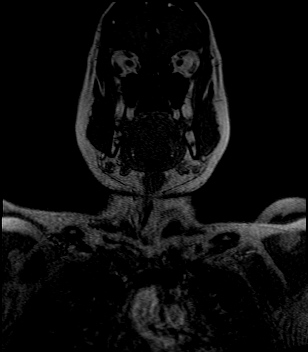
[im 14/80]
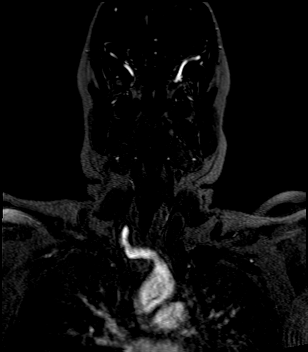
[im 27/80]
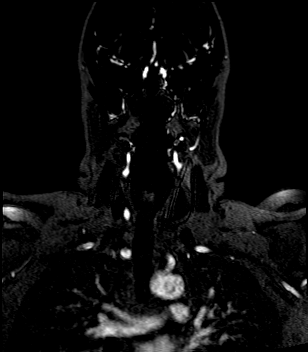
[im 40/80]
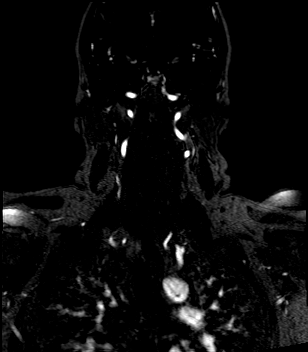
[im 53/80]
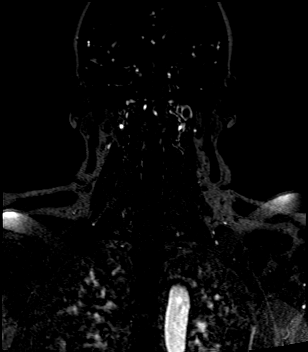
[im 66/80]
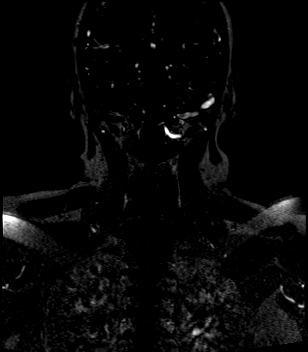
[im 80/80]
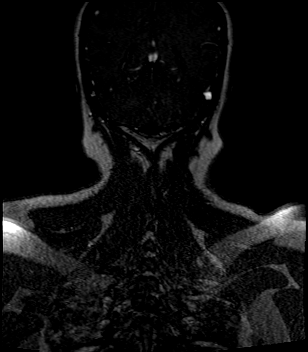

[Series 12: fl3d_ce_cor_+c_ttc=2.0s_moco-adv_sub · coronal · 1.0mm · 0.94mm/px · 7 of 80 slices shown (1 of 2)]
[im 1/80]
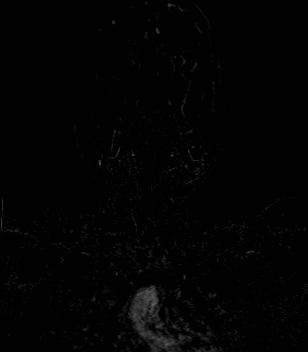
[im 14/80]
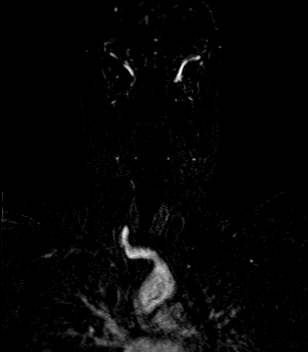
[im 27/80]
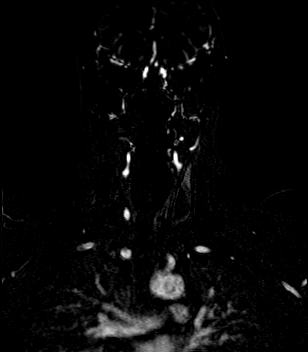
[im 40/80]
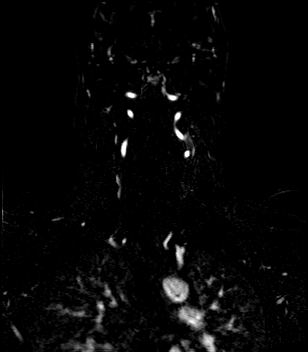
[im 53/80]
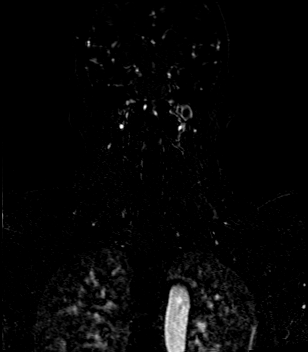
[im 66/80]
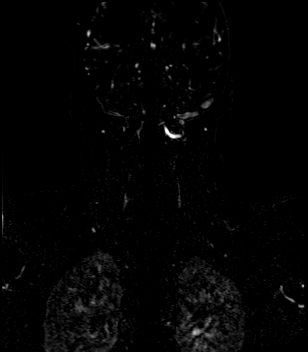
[im 80/80]
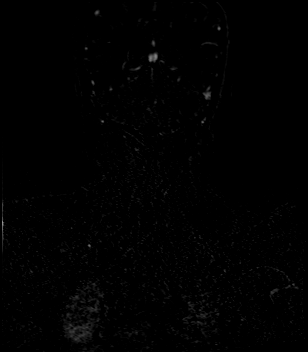

[Series 15: fl3d_ce_cor_+c_ttc=2.0s_moco-adv · coronal · 1.0mm · 0.94mm/px · 7 of 80 slices shown (2 of 2)]
[im 1/80]
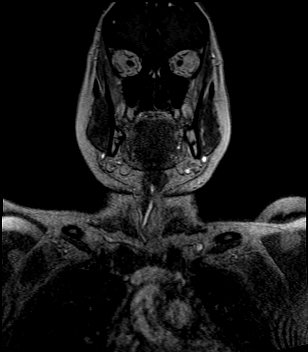
[im 14/80]
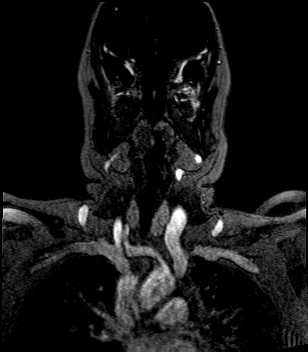
[im 27/80]
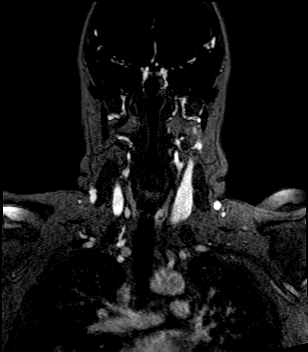
[im 40/80]
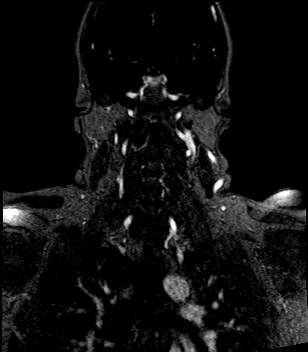
[im 53/80]
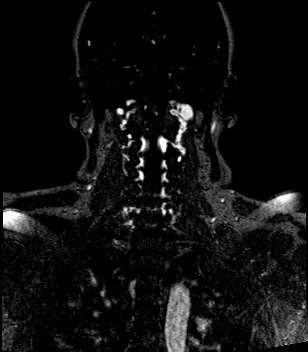
[im 66/80]
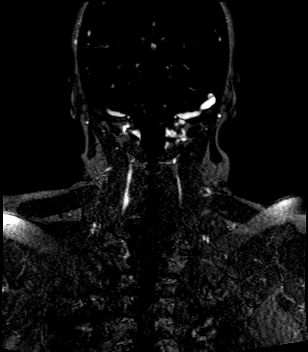
[im 80/80]
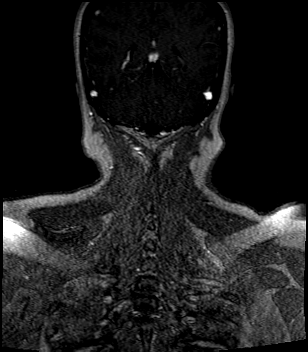

[Series 16: fl3d_ce_cor_+c_ttc=2.0s_moco-adv_sub · coronal · 1.0mm · 0.94mm/px · 7 of 80 slices shown (2 of 2)]
[im 1/80]
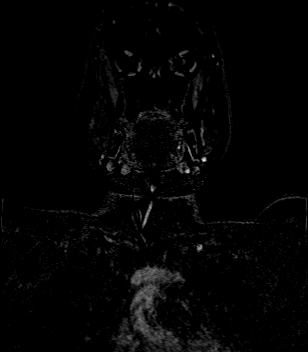
[im 14/80]
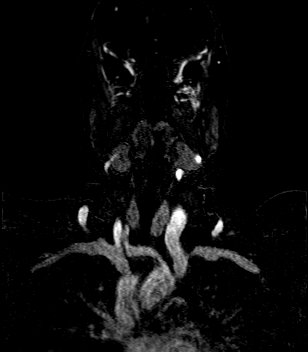
[im 27/80]
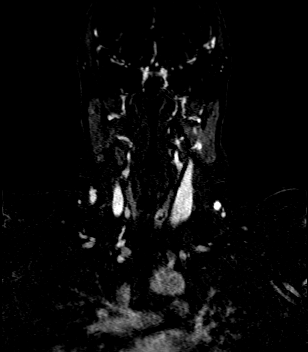
[im 40/80]
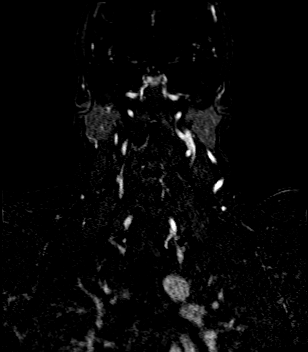
[im 53/80]
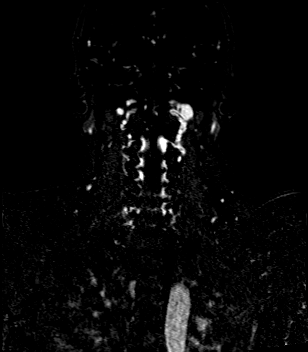
[im 66/80]
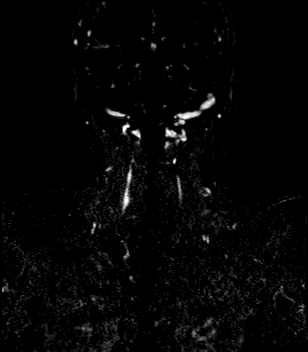
[im 80/80]
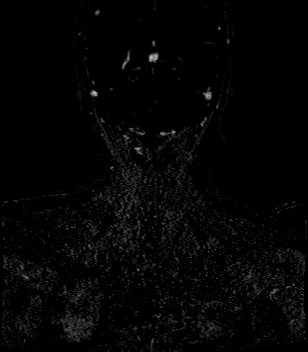

[Series 19: t1_vibe_(person_name)_axial+c_w · axial · 2.0mm · 0.89mm/px · z∈[-197,-43]mm · 7 of 80 slices shown]
[im 1/80]
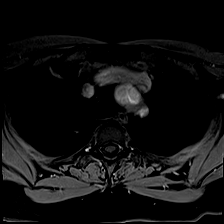
[im 14/80]
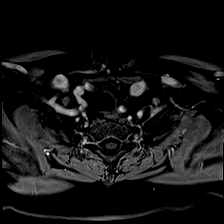
[im 27/80]
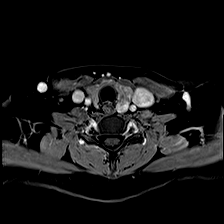
[im 40/80]
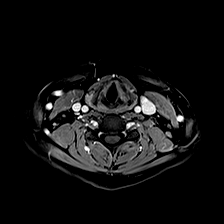
[im 53/80]
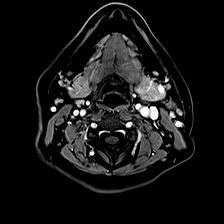
[im 66/80]
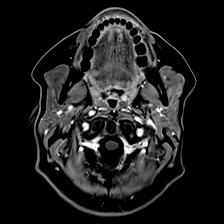
[im 80/80]
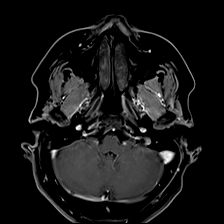

[48 of 48 positions shown; findings below may reference images not displayed]

FINDINGS: Aortic arch: The origins of the brachiocephalic, left common carotid  and the left subclavian arteries are patent.

Vertebral arteries: The right vertebral artery is patent.  The left vertebral artery is also patent.

Right carotid artery: The right common carotid, right internal and the right external carotid arteries are patent.

Left carotid artery: The left common carotid, left internal and the left external carotid arteries are also patent.

Heterogeneous thyroid gland with suggestion of multiple nodules. The dominant nodule is in the left lobe measuring 14 mm. No mass in the aerodigestive tract. No lymphadenopathy.

2D and 3D reconstructed images confirm the above findings.
IMPRESSION: No significant stenosis in the cervical vessels of the neck.

Heterogeneous, nodular thyroid gland with the largest nodules measuring up to 14 mm. Consider thyroid ultrasound.

Stat fax

## 2022-02-17 IMAGING — MG MAMMO SCRN BIL W/CAD TOMO
8 series · 8 of 24 positions shown · non-contrast
Comparison: The present examination has been compared to prior imaging studies.

Images Obtained from [facility]
INDICATION: Screening.
TECHNIQUE: Bilateral 2-D digital screening mammogram was performed followed by 3-D tomosynthesis.  Current study was also evaluated with a computer aided detection (CAD) system.
MAMMOGRAM FINDINGS:
The breasts are heterogeneously dense, which may obscure small masses.
No suspicious abnormality is seen in either breast.  There are no significant changes from the prior study.

[R CC]
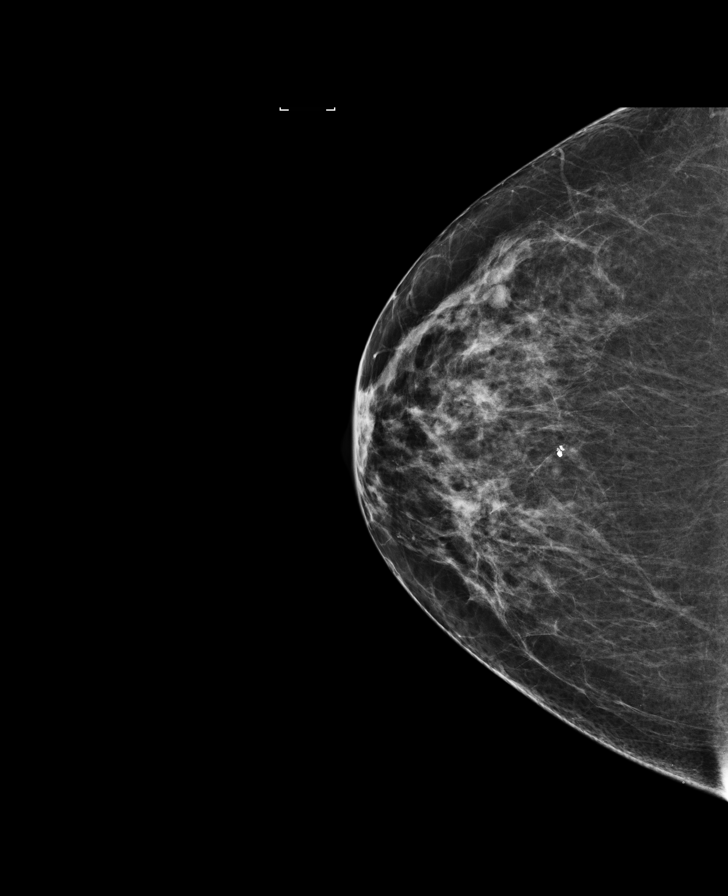

[R MLO]
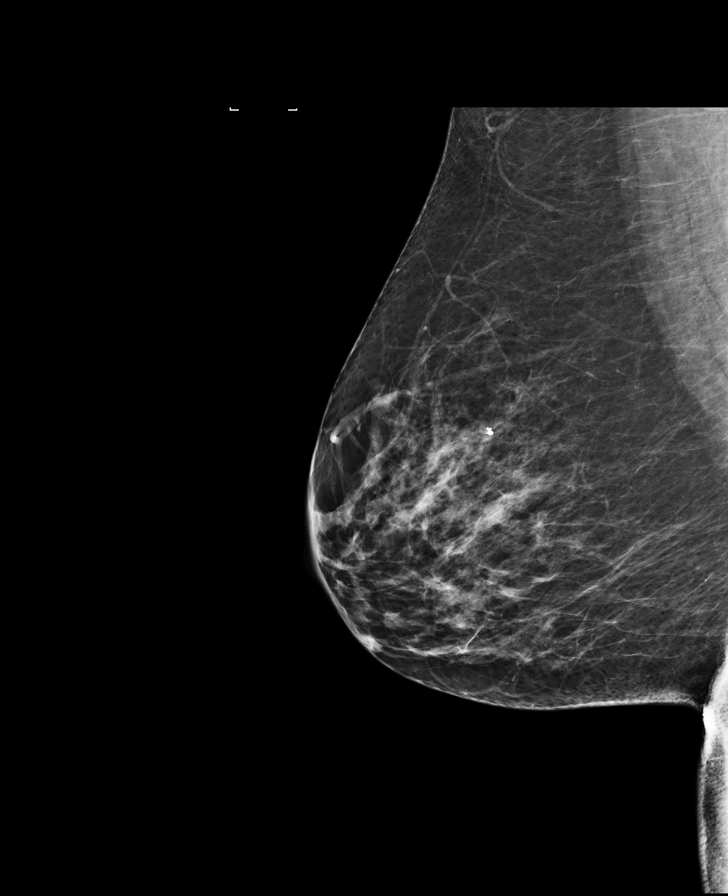

[L MLO]
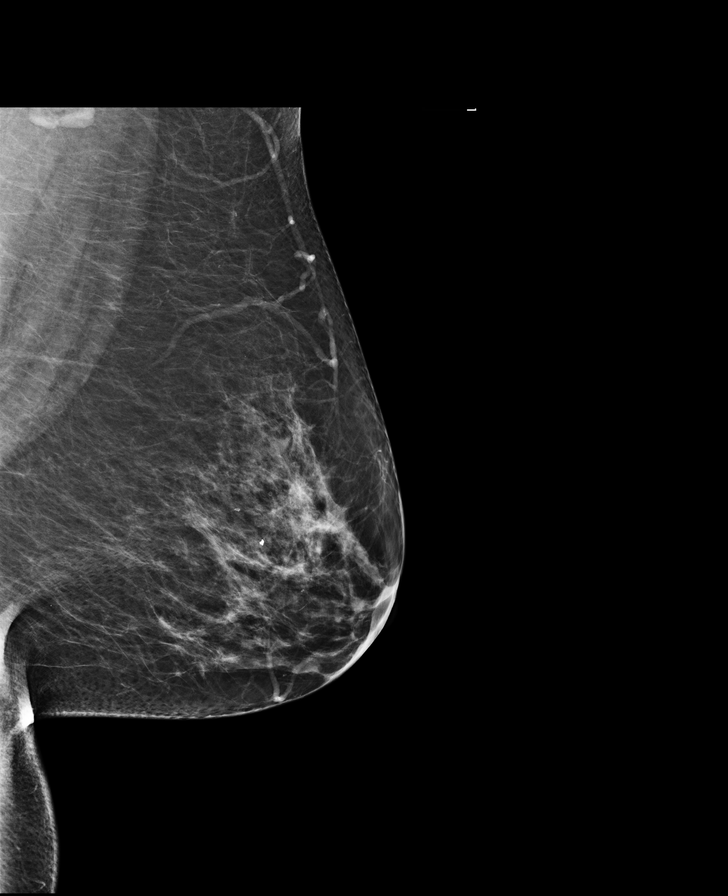

[L CC]
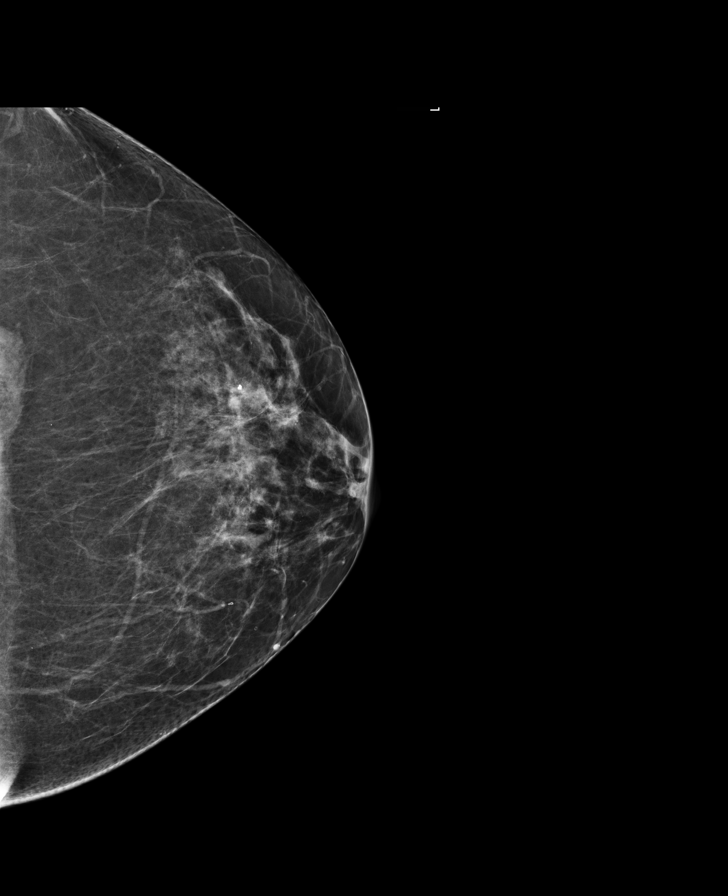

[R MLO tomo · tomo slice 41/80.0]
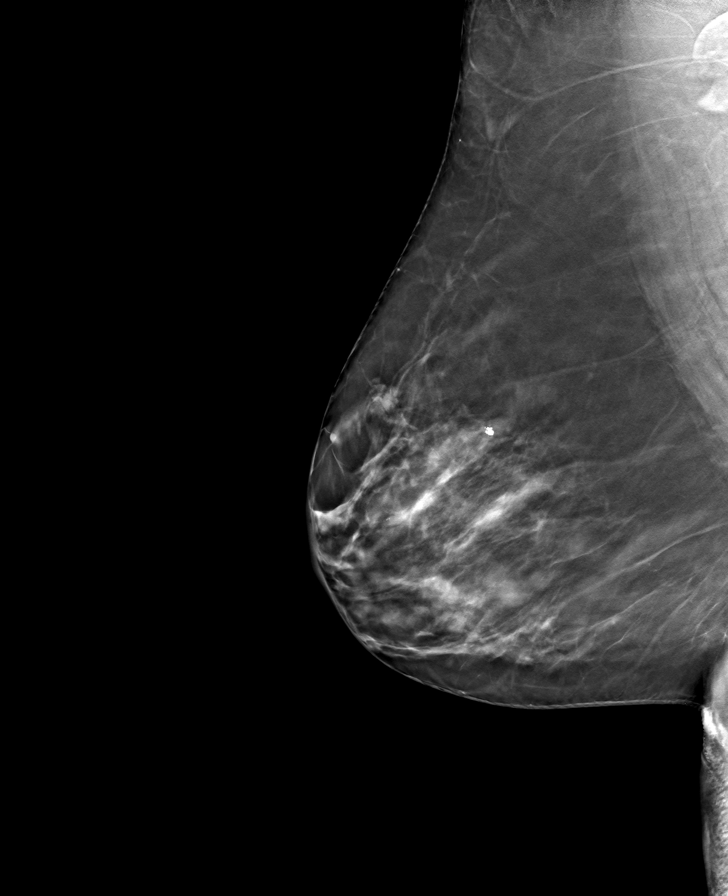

[L CC tomo · tomo slice 39/77.0]
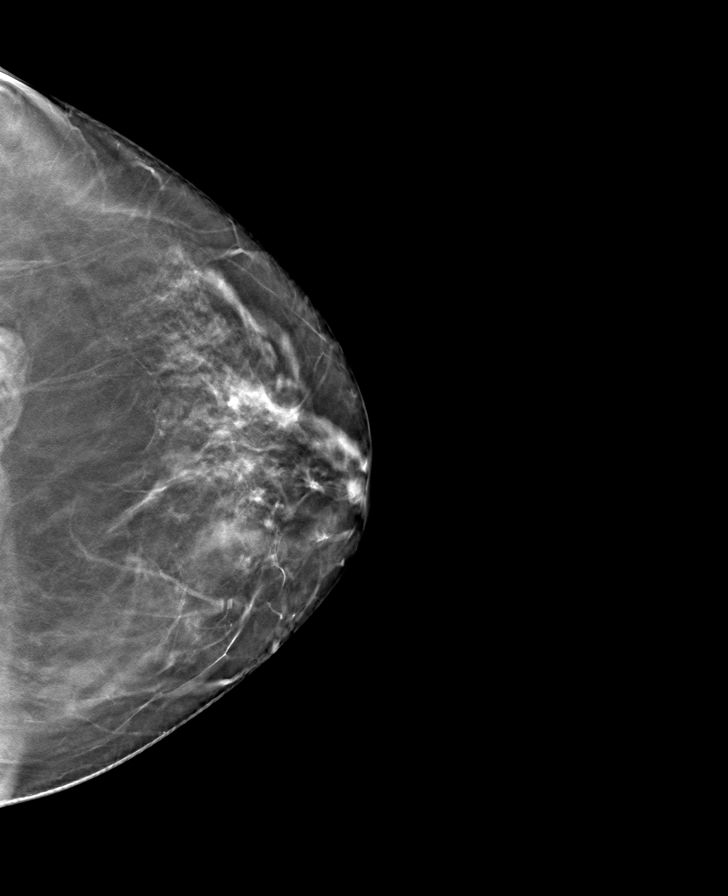

[L MLO tomo · tomo slice 44/87.0]
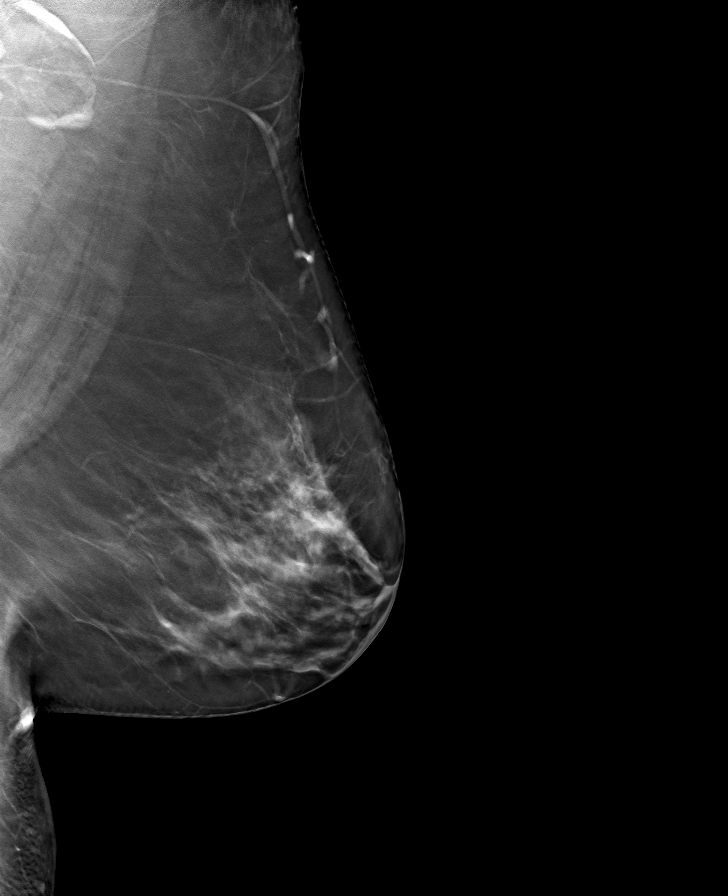

[R CC tomo · tomo slice 35/70.0]
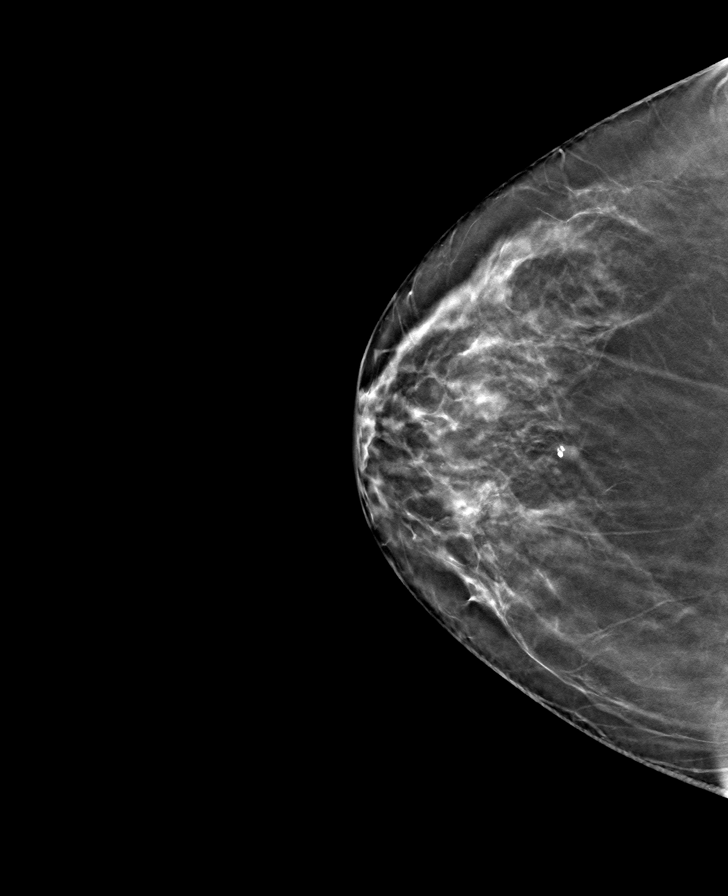

[8 of 24 positions shown; findings below may reference images not displayed]

IMPRESSION: There is no mammographic evidence of malignancy.
Screening mammogram recommended in 1 year.
BI-RADS Category 1: Negative
